# Patient Record
Sex: Male | Born: 1960
Health system: Southern US, Community
[De-identification: ages and names within clinical notes are randomized; demographics above are authoritative.]

## PROBLEM LIST (undated history)

## (undated) DIAGNOSIS — F419 Anxiety disorder, unspecified: Secondary | ICD-10-CM

## (undated) DIAGNOSIS — T7840XA Allergy, unspecified, initial encounter: Secondary | ICD-10-CM

## (undated) DIAGNOSIS — K219 Gastro-esophageal reflux disease without esophagitis: Secondary | ICD-10-CM

## (undated) DIAGNOSIS — F32A Depression, unspecified: Secondary | ICD-10-CM

## (undated) DIAGNOSIS — F329 Major depressive disorder, single episode, unspecified: Secondary | ICD-10-CM

## (undated) DIAGNOSIS — E785 Hyperlipidemia, unspecified: Secondary | ICD-10-CM

## (undated) DIAGNOSIS — I10 Essential (primary) hypertension: Secondary | ICD-10-CM

## (undated) DIAGNOSIS — G4730 Sleep apnea, unspecified: Secondary | ICD-10-CM

## (undated) HISTORY — PX: VASECTOMY REVERSAL: SHX243

## (undated) HISTORY — DX: Hyperlipidemia, unspecified: E78.5

## (undated) HISTORY — PX: CHOLECYSTECTOMY: SHX55

## (undated) HISTORY — DX: Anxiety disorder, unspecified: F41.9

## (undated) HISTORY — DX: Gastro-esophageal reflux disease without esophagitis: K21.9

## (undated) HISTORY — DX: Essential (primary) hypertension: I10

## (undated) HISTORY — DX: Depression, unspecified: F32.A

## (undated) HISTORY — PX: COLONOSCOPY: SHX174

## (undated) HISTORY — DX: Sleep apnea, unspecified: G47.30

## (undated) HISTORY — PX: TONSILLECTOMY AND ADENOIDECTOMY: SUR1326

## (undated) HISTORY — DX: Allergy, unspecified, initial encounter: T78.40XA

## (undated) HISTORY — PX: CARDIAC CATHETERIZATION: SHX172

---
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
## 1985-09-21 HISTORY — PX: VASECTOMY: SHX75

## 1992-09-21 HISTORY — PX: VASECTOMY REVERSAL: SHX243

## 1993-09-21 HISTORY — PX: OTHER SURGICAL HISTORY: SHX169

## 1998-09-21 HISTORY — PX: CHOLECYSTECTOMY: SHX55

## 1998-09-21 HISTORY — PX: CHOLECYSTECTOMY, LAPAROSCOPIC: SHX56

## 1999-10-19 ENCOUNTER — Emergency Department (HOSPITAL_COMMUNITY): Admission: EM | Admit: 1999-10-19 | Discharge: 1999-10-19 | Payer: Self-pay | Admitting: *Deleted

## 2010-11-07 LAB — HM COLONOSCOPY: HM Colonoscopy: NORMAL

## 2013-07-07 ENCOUNTER — Encounter: Payer: Self-pay | Admitting: Family Medicine

## 2013-07-07 ENCOUNTER — Ambulatory Visit (INDEPENDENT_AMBULATORY_CARE_PROVIDER_SITE_OTHER): Payer: Commercial Managed Care - PPO | Admitting: Family Medicine

## 2013-07-07 VITALS — BP 116/78 | HR 76 | Temp 98.1°F | Ht 73.0 in | Wt 218.0 lb

## 2013-07-07 DIAGNOSIS — E785 Hyperlipidemia, unspecified: Secondary | ICD-10-CM | POA: Insufficient documentation

## 2013-07-07 DIAGNOSIS — N529 Male erectile dysfunction, unspecified: Secondary | ICD-10-CM | POA: Insufficient documentation

## 2013-07-07 DIAGNOSIS — G4700 Insomnia, unspecified: Secondary | ICD-10-CM

## 2013-07-07 DIAGNOSIS — K219 Gastro-esophageal reflux disease without esophagitis: Secondary | ICD-10-CM

## 2013-07-07 DIAGNOSIS — Z125 Encounter for screening for malignant neoplasm of prostate: Secondary | ICD-10-CM

## 2013-07-07 DIAGNOSIS — Z0000 Encounter for general adult medical examination without abnormal findings: Secondary | ICD-10-CM | POA: Insufficient documentation

## 2013-07-07 DIAGNOSIS — I10 Essential (primary) hypertension: Secondary | ICD-10-CM | POA: Insufficient documentation

## 2013-07-07 LAB — LIPID PANEL
Cholesterol: 152 mg/dL (ref 0–200)
HDL: 47 mg/dL (ref >39)
Total CHOL/HDL Ratio: 3.2 Ratio
Triglycerides: 163 mg/dL — ABNORMAL HIGH (ref <150)

## 2013-07-07 LAB — COMPREHENSIVE METABOLIC PANEL
ALT: 21 U/L (ref 0–53)
Albumin: 4.3 g/dL (ref 3.5–5.2)
BUN: 18 mg/dL (ref 6–23)
CO2: 30 mEq/L (ref 19–32)
Calcium: 9.8 mg/dL (ref 8.4–10.5)
Chloride: 102 mEq/L (ref 96–112)
Creat: 1.09 mg/dL (ref 0.50–1.35)
Potassium: 4.4 mEq/L (ref 3.5–5.3)
Total Bilirubin: 0.9 mg/dL (ref 0.3–1.2)

## 2013-07-07 MED ORDER — OMEPRAZOLE 20 MG PO CPDR: 20.0000 mg | DELAYED_RELEASE_CAPSULE | Freq: Every day | ORAL | Status: DC | PRN

## 2013-07-07 MED ORDER — ZOLPIDEM TARTRATE 10 MG PO TABS: 10.0000 mg | ORAL_TABLET | Freq: Every evening | ORAL | Status: DC | PRN

## 2013-07-07 MED ORDER — TADALAFIL 5 MG PO TABS: 5.0000 mg | ORAL_TABLET | Freq: Every day | ORAL | Status: DC | PRN

## 2013-07-07 MED ORDER — RANITIDINE HCL 150 MG PO CAPS: 150.0000 mg | ORAL_CAPSULE | Freq: Two times a day (BID) | ORAL | Status: DC | PRN

## 2013-07-07 MED ORDER — ROSUVASTATIN CALCIUM 40 MG PO TABS: 40.0000 mg | ORAL_TABLET | Freq: Every day | ORAL | Status: DC

## 2013-07-07 MED ORDER — OLMESARTAN MEDOXOMIL 20 MG PO TABS: 20.0000 mg | ORAL_TABLET | Freq: Every day | ORAL | Status: DC

## 2013-07-07 NOTE — Patient Instructions (Signed)
It was lovely to meet you. We will call you with your lab results. You may also view them online.

## 2013-07-07 NOTE — Progress Notes (Signed)
Subjective:    Patient ID: Joseph Church, male    DOB: 1960-10-17, 52 y.o.   MRN: [id number]  HPI  Very pleasant 2 male here to establish care.  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  HTN- on Benicar 20 mg daily.  BP has been well controlled on this. No CP, SOB or LE edema.  BP was not controlled on amlodipine and metoprolol worsened ED.  HLD- on Crestor 40 mg qhs.  Due for labs.  Denies myalgias.  Insomnia- takes Ambien approx 20 out of 30 days.  Does not take on weekends or nights that he does not have to work.  Could not tolerate antihistamines or lunesta.  GERD- on Zantac and Omeprazole.  Non smoker. Does drink energy drinks.  UTD colonoscopy- 2012- cleared for 10 years.  ED- takes daily cialis without side effects.  He is pleased with results.  Has been dating someone since May.  Patient Active Problem List   Diagnosis Date Noted  . Insomnia 07/07/2013  . Routine general medical examination at a health care facility 07/07/2013  . Erectile dysfunction 07/07/2013  . GERD (gastroesophageal reflux disease)   . Hyperlipidemia   . Hypertension    Past Medical History  Diagnosis Date  . GERD (gastroesophageal reflux disease)   . Hyperlipidemia   . Hypertension    Past Surgical History  Procedure Laterality Date  . Cholecystectomy, laparoscopic  2000  . Tonsillectomy and adenoidectomy  1960's  . Vasectomy  1987  . Vasectomy reversal  1994  . Keratotomy  1995   History  Substance Use Topics  . Smoking status: Never Smoker   . Smokeless tobacco: Not on file  . Alcohol Use: 5.0 oz/week    10 drink(s) per week   Family History  Problem Relation Age of Onset  . Cancer Mother   . Cancer Father     lymphoma   No Known Allergies No current outpatient prescriptions on file prior to visit.   No current facility-administered medications on file prior to visit.   The PMH, PSH, Social History, Family History, Medications, and allergies have been  reviewed in [facility] and have been updated if relevant.  Review of Systems    See HPI No CP, SOB or blurred vision Denies insomnia No abdominal pain Objective:   Physical Exam BP 116/78  Pulse 76  Temp(Src) 98.1 F (36.7 C) (Oral)  Ht 6\' 1"  (1.854 m)  Wt 218 lb (98.884 kg)  BMI 28.77 kg/m2  SpO2 97% General:  pleasant male in NAD Eyes:  PERRL Ears:  External ear exam shows no significant lesions or deformities.  Otoscopic examination reveals clear canals, tympanic membranes are intact bilaterally without bulging, retraction, inflammation or discharge. Hearing is grossly normal bilaterally. Nose:  External nasal examination shows no deformity or inflammation. Nasal mucosa are pink and moist without lesions or exudates. Mouth:  Oral mucosa and oropharynx without lesions or exudates.  Teeth in good repair. Neck:  no carotid bruit or thyromegaly no cervical or supraclavicular lymphadenopathy  Lungs:  Normal respiratory effort, chest expands symmetrically. Lungs are clear to auscultation, no crackles or wheezes. Heart:  Normal rate and regular rhythm. S1 and S2 normal without gallop, murmur, click, rub or other extra sounds. Abdomen:  Bowel sounds positive,abdomen soft and non-tender without masses, organomegaly or hernias noted. Pulses:  R and L posterior tibial pulses are full and equal bilaterally  Extremities:  no edema    Assessment & Plan:  1. GERD (gastroesophageal reflux disease) Well controlled on Zantac and PPI.  2. Hyperlipidemia Refilled Crestor.  Check lipids today. - Comprehensive metabolic panel - Lipid Panel  3. Hypertension Stable on Benicar.  Rx refilled. - Comprehensive metabolic panel  4. Insomnia Stable on ambien. Rx refilled.  5. Screening for prostate cancer   He desires testing PSA at this point.  - PSA  6. Erectile dysfunction On cialis daily. Rx refilled.

## 2013-07-11 ENCOUNTER — Encounter: Payer: Self-pay | Admitting: *Deleted

## 2013-11-07 ENCOUNTER — Ambulatory Visit: Payer: Commercial Managed Care - PPO | Admitting: Family Medicine

## 2013-11-09 ENCOUNTER — Encounter: Payer: Self-pay | Admitting: Family Medicine

## 2013-11-09 ENCOUNTER — Ambulatory Visit (INDEPENDENT_AMBULATORY_CARE_PROVIDER_SITE_OTHER): Payer: Commercial Managed Care - PPO | Admitting: Family Medicine

## 2013-11-09 ENCOUNTER — Ambulatory Visit: Payer: Commercial Managed Care - PPO | Admitting: Family Medicine

## 2013-11-09 VITALS — BP 122/74 | HR 76 | Temp 98.1°F | Ht 72.75 in | Wt 223.5 lb

## 2013-11-09 DIAGNOSIS — E785 Hyperlipidemia, unspecified: Secondary | ICD-10-CM

## 2013-11-09 DIAGNOSIS — G4700 Insomnia, unspecified: Secondary | ICD-10-CM

## 2013-11-09 DIAGNOSIS — I10 Essential (primary) hypertension: Secondary | ICD-10-CM

## 2013-11-09 MED ORDER — OMEPRAZOLE 20 MG PO CPDR: 20.0000 mg | DELAYED_RELEASE_CAPSULE | Freq: Every day | ORAL | Status: DC | PRN

## 2013-11-09 MED ORDER — OLMESARTAN MEDOXOMIL 20 MG PO TABS: 20.0000 mg | ORAL_TABLET | Freq: Every day | ORAL | Status: DC

## 2013-11-09 MED ORDER — ZOLPIDEM TARTRATE 10 MG PO TABS: 10.0000 mg | ORAL_TABLET | Freq: Every evening | ORAL | Status: DC | PRN

## 2013-11-09 MED ORDER — ROSUVASTATIN CALCIUM 40 MG PO TABS: 40.0000 mg | ORAL_TABLET | Freq: Every day | ORAL | Status: DC

## 2013-11-09 MED ORDER — RANITIDINE HCL 150 MG PO CAPS: 150.0000 mg | ORAL_CAPSULE | Freq: Two times a day (BID) | ORAL | Status: DC | PRN

## 2013-11-09 NOTE — Progress Notes (Signed)
Subjective:    Patient ID: Joseph Church, male    DOB: 14-Aug-1961, 53 y.o.   MRN: [id number]  HPI  Very pleasant 53 yo male here for med refills. He has no complaints today.  Doing well.  Working mainly in [employer].  HTN- on Benicar 20 mg daily.  BP has been well controlled on this. No CP, SOB or LE edema.  BP was not controlled on amlodipine and metoprolol worsened ED.  HLD- on Crestor 40 mg qhs.   Denies myalgias. Lab Results  Component Value Date   CHOL 152 07/07/2013   HDL 47 07/07/2013   LDLCALC 72 07/07/2013   TRIG 163* 07/07/2013   CHOLHDL 3.2 07/07/2013    Insomnia- takes Ambien approx 20 out of 30 days.  Does not take on weekends or nights that he does not have to work.  Could not tolerate antihistamines or lunesta.  ED- takes daily cialis without side effects.  He is pleased with results.    Patient Active Problem List   Diagnosis Date Noted  . Insomnia 07/07/2013  . Erectile dysfunction 07/07/2013  . GERD (gastroesophageal reflux disease)   . Hyperlipidemia   . Hypertension    Past Medical History  Diagnosis Date  . GERD (gastroesophageal reflux disease)   . Hyperlipidemia   . Hypertension    Past Surgical History  Procedure Laterality Date  . Cholecystectomy, laparoscopic  2000  . Tonsillectomy and adenoidectomy  1960's  . Vasectomy  1987  . Vasectomy reversal  1994  . Keratotomy  1995   History  Substance Use Topics  . Smoking status: Never Smoker   . Smokeless tobacco: Not on file  . Alcohol Use: 5.0 oz/week    10 drink(s) per week   Family History  Problem Relation Age of Onset  . Cancer Mother   . Cancer Father     lymphoma   No Known Allergies Current Outpatient Prescriptions on File Prior to Visit  Medication Sig Dispense Refill  . aspirin 81 MG tablet Take 81 mg by mouth daily.      Marland Kitchen olmesartan (BENICAR) 20 MG tablet Take 1 tablet (20 mg total) by mouth daily.  30 tablet  11  . omeprazole (PRILOSEC) 20 MG capsule Take 1 capsule  (20 mg total) by mouth daily as needed.  30 capsule  11  . ranitidine (ZANTAC) 150 MG capsule Take 1 capsule (150 mg total) by mouth 2 (two) times daily as needed for heartburn.  30 capsule  11  . rosuvastatin (CRESTOR) 40 MG tablet Take 1 tablet (40 mg total) by mouth at bedtime.  30 tablet  11  . tadalafil (CIALIS) 5 MG tablet Take 1 tablet (5 mg total) by mouth daily as needed for erectile dysfunction.  30 tablet  6  . zolpidem (AMBIEN) 10 MG tablet Take 1 tablet (10 mg total) by mouth at bedtime as needed for sleep.  30 tablet  3   No current facility-administered medications on file prior to visit.   The PMH, PSH, Social History, Family History, Medications, and allergies have been reviewed in [facility], and have been updated if relevant.  Review of Systems    See HPI No CP, SOB or blurred vision Denies insomnia No abdominal pain Objective:   Physical Exam BP 122/74  Pulse 76  Temp(Src) 98.1 F (36.7 C) (Oral)  Ht 6' 0.75" (1.848 m)  Wt 223 lb 8 oz (101.379 kg)  BMI 29.69 kg/m2  SpO2 97%  General:  pleasant  male in NAD Eyes:  PERRL Ears:  External ear exam shows no significant lesions or deformities.  Otoscopic examination reveals clear canals, tympanic membranes are intact bilaterally without bulging, retraction, inflammation or discharge. Hearing is grossly normal bilaterally. Nose:  External nasal examination shows no deformity or inflammation. Nasal mucosa are pink and moist without lesions or exudates. Mouth:  Oral mucosa and oropharynx without lesions or exudates.  Teeth in good repair. Neck:  no carotid bruit or thyromegaly no cervical or supraclavicular lymphadenopathy  Lungs:  Normal respiratory effort, chest expands symmetrically. Lungs are clear to auscultation, no crackles or wheezes. Heart:  Normal rate and regular rhythm. S1 and S2 normal without gallop, murmur, click, rub or other extra sounds. Abdomen:  Bowel sounds positive,abdomen soft and non-tender without  masses, organomegaly or hernias noted. Pulses:  R and L posterior tibial pulses are full and equal bilaterally  Extremities:  no edema    Assessment & Plan:

## 2013-11-09 NOTE — Progress Notes (Signed)
Pre-visit discussion using our clinic review tool. No additional management support is needed unless otherwise documented below in the visit note.  

## 2013-11-09 NOTE — Assessment & Plan Note (Signed)
Well controlled on current dose of Crestor. No changes.

## 2013-11-09 NOTE — Assessment & Plan Note (Signed)
Well controlled on current rx. No changes. 

## 2013-11-09 NOTE — Assessment & Plan Note (Signed)
Uses Joseph Church but tries to only take when he has to work the next day. Rx refilled today.

## 2013-12-19 ENCOUNTER — Encounter: Payer: Self-pay | Admitting: Internal Medicine

## 2013-12-19 ENCOUNTER — Ambulatory Visit (INDEPENDENT_AMBULATORY_CARE_PROVIDER_SITE_OTHER): Payer: Commercial Managed Care - PPO | Admitting: Internal Medicine

## 2013-12-19 VITALS — BP 122/84 | HR 71 | Temp 98.2°F | Wt 222.2 lb

## 2013-12-19 DIAGNOSIS — R079 Chest pain, unspecified: Secondary | ICD-10-CM

## 2013-12-19 LAB — CBC
HCT: 46.9 % (ref 39.0–52.0)
HEMOGLOBIN: 16.3 g/dL (ref 13.0–17.0)
MCH: 30.5 pg (ref 26.0–34.0)
MCHC: 34.8 g/dL (ref 30.0–36.0)
MCV: 87.8 fL (ref 78.0–100.0)
Platelets: 231 10*3/uL (ref 150–400)
RBC: 5.34 MIL/uL (ref 4.22–5.81)
RDW: 13.5 % (ref 11.5–15.5)
WBC: 8.6 10*3/uL (ref 4.0–10.5)

## 2013-12-19 LAB — COMPREHENSIVE METABOLIC PANEL
ALK PHOS: 60 U/L (ref 39–117)
ALT: 23 U/L (ref 0–53)
AST: 20 U/L (ref 0–37)
Albumin: 4.3 g/dL (ref 3.5–5.2)
BUN: 15 mg/dL (ref 6–23)
CO2: 27 meq/L (ref 19–32)
CREATININE: 1.05 mg/dL (ref 0.50–1.35)
Calcium: 9.7 mg/dL (ref 8.4–10.5)
Chloride: 101 mEq/L (ref 96–112)
GLUCOSE: 97 mg/dL (ref 70–99)
Potassium: 4.6 mEq/L (ref 3.5–5.3)
SODIUM: 138 meq/L (ref 135–145)
Total Bilirubin: 0.5 mg/dL (ref 0.2–1.2)
Total Protein: 6.9 g/dL (ref 6.0–8.3)

## 2013-12-19 LAB — TROPONIN I

## 2013-12-19 LAB — CK TOTAL AND CKMB (NOT AT ARMC)
CK TOTAL: 128 U/L (ref 7–232)
CK, MB: 1.2 ng/mL (ref 0.0–5.0)
RELATIVE INDEX: 0.9 (ref 0.0–4.0)

## 2013-12-19 NOTE — Progress Notes (Signed)
Pre visit review using our clinic review tool, if applicable. No additional management support is needed unless otherwise documented below in the visit note. 

## 2013-12-19 NOTE — Progress Notes (Signed)
Subjective:    Patient ID: Joseph Church, male    DOB: June 11, 1961, 53 y.o.   MRN: [id number]  HPI  Pt presents to the clinic today with c/o chest pain x 2 weeks. He reports the pain is a dull, achy pain but seems to have been more constant over the last day. The pain does not radiate. It is not worse with exertion. He denies trauma to the chest.  He denies chest tightness, sweating, dizziness, blurred vision or shortness of breath. He does bring in a copy of an ECG he had this am. It shows incomplete RBBB, short QTc interval (? Hypercalcemia), and possible myocardial ischemia. He did have a cardiac cath about 3 years ago with did show 255 blockage in 2 vessels. He does have a history of HTN and HLD both of which are well controlled.   Review of Systems      Past Medical History  Diagnosis Date  . GERD (gastroesophageal reflux disease)   . Hyperlipidemia   . Hypertension     Current Outpatient Prescriptions  Medication Sig Dispense Refill  . aspirin 81 MG tablet Take 81 mg by mouth daily.      Marland Kitchen olmesartan (BENICAR) 20 MG tablet Take 1 tablet (20 mg total) by mouth daily.  90 tablet  3  . omeprazole (PRILOSEC) 20 MG capsule Take 1 capsule (20 mg total) by mouth daily as needed.  90 capsule  3  . ranitidine (ZANTAC) 150 MG capsule Take 1 capsule (150 mg total) by mouth 2 (two) times daily as needed for heartburn.  90 capsule  3  . rosuvastatin (CRESTOR) 40 MG tablet Take 1 tablet (40 mg total) by mouth at bedtime.  90 tablet  3  . tadalafil (CIALIS) 5 MG tablet Take 1 tablet (5 mg total) by mouth daily as needed for erectile dysfunction.  30 tablet  6  . zolpidem (AMBIEN) 10 MG tablet Take 1 tablet (10 mg total) by mouth at bedtime as needed for sleep.  30 tablet  3   No current facility-administered medications for this visit.    No Known Allergies  Family History  Problem Relation Age of Onset  . Cancer Mother   . Cancer Father     lymphoma    History   Social History    . Marital Status: Divorced    Spouse Name: N/A    Number of Children: N/A  . Years of Education: N/A   Occupational History  . Not on file.   Social History Main Topics  . Smoking status: Never Smoker   . Smokeless tobacco: Not on file  . Alcohol Use: 5.0 oz/week    10 drink(s) per week  . Drug Use: Not on file  . Sexual Activity: Not on file   Other Topics Concern  . 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  Respiratory: Denies difficulty breathing, shortness of breath, cough or sputum production.   Cardiovascular: Pt reports chest pain. Denies chest tightness, palpitations or swelling in the hands or feet.  Neurological: Denies dizziness, difficulty with memory, difficulty with speech or problems with balance and coordination.   No other specific complaints in a complete review of systems (except as listed in HPI above).  Objective:   Physical  Exam   BP 122/84  Pulse 71  Temp(Src) 98.2 F (36.8 C) (Oral)  Wt 222 lb 4 oz (100.812 kg)  SpO2 99% Wt Readings from Last 3 Encounters:  12/19/13 222 lb 4 oz (100.812 kg)  11/09/13 223 lb 8 oz (101.379 kg)  07/07/13 218 lb (98.884 kg)    General: Appears his stated age, well developed, well nourished in NAD. Cardiovascular: Normal rate and rhythm. S1,S2 noted.  No murmur, rubs or gallops noted. No JVD or BLE edema. No carotid bruits noted. Pulmonary/Chest: Normal effort and positive vesicular breath sounds. No respiratory distress. No wheezes, rales or ronchi noted.  Neurological: Alert and oriented. Marland Kitchen   BMET    Component Value Date/Time   NA 140 07/07/2013 1433   K 4.4 07/07/2013 1433   CL 102 07/07/2013 1433   CO2 30 07/07/2013 1433   GLUCOSE 87 07/07/2013 1433   BUN 18 07/07/2013 1433   CREATININE 1.09  07/07/2013 1433   CALCIUM 9.8 07/07/2013 1433    Lipid Panel     Component Value Date/Time   CHOL 152 07/07/2013 1433   TRIG 163* 07/07/2013 1433   HDL 47 07/07/2013 1433   CHOLHDL 3.2 07/07/2013 1433   VLDL 33 07/07/2013 1433   LDLCALC 72 07/07/2013 1433           Assessment & Plan:   Chest pain:  ECG concerning for hypercalcemia and possible myocardial ischemia He is not in any distress at the moment Will check CBC, CMET and cardiac enzymes stat  Will call you as soon as the labs come back, if worse before I call you, please go straight to the ER.

## 2013-12-19 NOTE — Patient Instructions (Addendum)
Chest Pain (Nonspecific) °It is often hard to give a specific diagnosis for the cause of chest pain. There is always a chance that your pain could be related to something serious, such as a heart attack or a blood clot in the lungs. You need to follow up with your caregiver for further evaluation. °CAUSES  °· Heartburn. °· Pneumonia or bronchitis. °· Anxiety or stress. °· Inflammation around your heart (pericarditis) or lung (pleuritis or pleurisy). °· A blood clot in the lung. °· A collapsed lung (pneumothorax). It can develop suddenly on its own (spontaneous pneumothorax) or from injury (trauma) to the chest. °· Shingles infection (herpes zoster virus). °The chest wall is composed of bones, muscles, and cartilage. Any of these can be the source of the pain. °· The bones can be bruised by injury. °· The muscles or cartilage can be strained by coughing or overwork. °· The cartilage can be affected by inflammation and become sore (costochondritis). °DIAGNOSIS  °Lab tests or other studies, such as X-rays, electrocardiography, stress testing, or cardiac imaging, may be needed to find the cause of your pain.  °TREATMENT  °· Treatment depends on what may be causing your chest pain. Treatment may include: °· Acid blockers for heartburn. °· Anti-inflammatory medicine. °· Pain medicine for inflammatory conditions. °· Antibiotics if an infection is present. °· You may be advised to change lifestyle habits. This includes stopping smoking and avoiding alcohol, caffeine, and chocolate. °· You may be advised to keep your head raised (elevated) when sleeping. This reduces the chance of acid going backward from your stomach into your esophagus. °· Most of the time, nonspecific chest pain will improve within 2 to 3 days with rest and mild pain medicine. °HOME CARE INSTRUCTIONS  °· If antibiotics were prescribed, take your antibiotics as directed. Finish them even if you start to feel better. °· For the next few days, avoid physical  activities that bring on chest pain. Continue physical activities as directed. °· Do not smoke. °· Avoid drinking alcohol. °· Only take over-the-counter or prescription medicine for pain, discomfort, or fever as directed by your caregiver. °· Follow your caregiver's suggestions for further testing if your chest pain does not go away. °· Keep any follow-up appointments you made. If you do not go to an appointment, you could develop lasting (chronic) problems with pain. If there is any problem keeping an appointment, you must call to reschedule. °SEEK MEDICAL CARE IF:  °· You think you are having problems from the medicine you are taking. Read your medicine instructions carefully. °· Your chest pain does not go away, even after treatment. °· You develop a rash with blisters on your chest. °SEEK IMMEDIATE MEDICAL CARE IF:  °· You have increased chest pain or pain that spreads to your arm, neck, jaw, back, or abdomen. °· You develop shortness of breath, an increasing cough, or you are coughing up blood. °· You have severe back or abdominal pain, feel nauseous, or vomit. °· You develop severe weakness, fainting, or chills. °· You have a fever. °THIS IS AN EMERGENCY. Do not wait to see if the pain will go away. Get medical help at once. Call your local emergency services (911 in [place] Do not drive yourself to the hospital. °MAKE SURE YOU:  °· Understand these instructions. °· Will watch your condition. °· Will get help right away if you are not doing well or get worse. °Document Released: 06/17/2005 Document Revised: 11/30/2011 Document Reviewed: 04/12/2008 °ExitCare® Patient Information ©2014 [employer]   [employer]. ° °

## 2014-01-11 ENCOUNTER — Encounter: Payer: Self-pay | Admitting: Cardiovascular Disease

## 2014-01-11 ENCOUNTER — Ambulatory Visit (INDEPENDENT_AMBULATORY_CARE_PROVIDER_SITE_OTHER): Payer: Commercial Managed Care - PPO | Admitting: Cardiovascular Disease

## 2014-01-11 VITALS — BP 102/82 | HR 69 | Ht 73.0 in | Wt 225.8 lb

## 2014-01-11 DIAGNOSIS — E785 Hyperlipidemia, unspecified: Secondary | ICD-10-CM

## 2014-01-11 DIAGNOSIS — R079 Chest pain, unspecified: Secondary | ICD-10-CM

## 2014-01-11 DIAGNOSIS — I10 Essential (primary) hypertension: Secondary | ICD-10-CM

## 2014-01-11 NOTE — Patient Instructions (Signed)
Your physician has requested that you have an exercise tolerance test. For further information please visit [email or web address]. Please also follow instruction sheet, as given.   Your physician recommends that you schedule a follow-up appointment in:  As needed

## 2014-01-14 ENCOUNTER — Encounter: Payer: Self-pay | Admitting: Cardiovascular Disease

## 2014-01-14 DIAGNOSIS — R079 Chest pain, unspecified: Secondary | ICD-10-CM | POA: Insufficient documentation

## 2014-01-14 NOTE — Assessment & Plan Note (Signed)
Blood pressure is well controlled on current medications. 

## 2014-01-14 NOTE — Progress Notes (Signed)
HPI  This is a 53 year old man who was referred for evaluation of chest pain. He has no previous cardiac history. He has known history of hypertension, hyperlipidemia, gastroesophageal reflux disease and erectile dysfunction. He reports chest pain over the last 4-6 weeks. He describes substernal tightness feeling which does not radiate. This happened at rest and does not worsen with physical activities. The pain is not pleuritic.  He had previous cardiac catheterization done in 2003 in [facility] which showed 25% stenosis in the proximal RCA and 25% stenosis in the proximal left circumflex. Ejection fraction was 56%. No cardiac events since then.  He does not smoke. He has no family history of coronary artery disease. He works as a PA at [employer].  No Known Allergies   Current Outpatient Prescriptions on File Prior to Visit  Medication Sig Dispense Refill  . aspirin 81 MG tablet Take 81 mg by mouth daily.      Marland Kitchen olmesartan (BENICAR) 20 MG tablet Take 1 tablet (20 mg total) by mouth daily.  90 tablet  3  . omeprazole (PRILOSEC) 20 MG capsule Take 1 capsule (20 mg total) by mouth daily as needed.  90 capsule  3  . ranitidine (ZANTAC) 150 MG capsule Take 1 capsule (150 mg total) by mouth 2 (two) times daily as needed for heartburn.  90 capsule  3  . rosuvastatin (CRESTOR) 40 MG tablet Take 1 tablet (40 mg total) by mouth at bedtime.  90 tablet  3  . zolpidem (AMBIEN) 10 MG tablet Take 1 tablet (10 mg total) by mouth at bedtime as needed for sleep.  30 tablet  3   No current facility-administered medications on file prior to visit.     Past Medical History  Diagnosis Date  . GERD (gastroesophageal reflux disease)   . Hyperlipidemia   . Hypertension      Past Surgical History  Procedure Laterality Date  . Cholecystectomy, laparoscopic  2000  . Tonsillectomy and adenoidectomy  1960's  . Vasectomy  1987  . Vasectomy reversal  1994  . Keratotomy  1995  . Cardiac  catheterization      [facility];[place]     Family History  Problem Relation Age of Onset  . Cancer Mother   . Cancer Father     lymphoma  . Hypertension Father   . Hyperlipidemia Father      History   Social History  . Marital Status: Divorced    Spouse Name: N/A    Number of Children: N/A  . Years of Education: N/A   Occupational History  . Not on file.   Social History Main Topics  . Smoking status: Never Smoker   . Smokeless tobacco: Not on file  . Alcohol Use: 5.0 oz/week    10 drink(s) per week     Comment: daily  . Drug Use: No  . Sexual Activity: Not on file   Other Topics Concern  . 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 10 point review of system was performed. It is negative other than that mentioned in the history of present illness.   PHYSICAL EXAM   BP 102/82  Pulse 69  Ht 6\' 1"  (1.854 m)  Wt 225 lb 12 oz (102.4 kg)  BMI 29.79 kg/m2 Constitutional: He is  oriented to person, place, and time. He appears well-developed and well-nourished. No distress.  HENT: No nasal discharge.  Head: Normocephalic and atraumatic.  Eyes: Pupils are equal and round.  No discharge. Neck: Normal range of motion. Neck supple. No JVD present. No thyromegaly present.  Cardiovascular: Normal rate, regular rhythm, normal heart sounds. Exam reveals no gallop and no friction rub. No murmur heard.  Pulmonary/Chest: Effort normal and breath sounds normal. No stridor. No respiratory distress. He has no wheezes. He has no rales. He exhibits no tenderness.  Abdominal: Soft. Bowel sounds are normal. He exhibits no distension. There is no tenderness. There is no rebound and no guarding.  Musculoskeletal: Normal range of motion. He exhibits no edema and no tenderness.  Neurological: He is alert and oriented to person,  place, and time. Coordination normal.  Skin: Skin is warm and dry. No rash noted. He is not diaphoretic. No erythema. No pallor.  Psychiatric: He has a normal mood and affect. His behavior is normal. Judgment and thought content normal.       EKG: Normal sinus rhythm with no significant ST or T wave changes. Normal QT interval.   ASSESSMENT AND PLAN

## 2014-01-14 NOTE — Assessment & Plan Note (Signed)
Lab Results  Component Value Date   CHOL 152 07/07/2013   HDL 47 07/07/2013   LDLCALC 72 07/07/2013   TRIG 163* 07/07/2013   CHOLHDL 3.2 07/07/2013   Lipid profile is well controlled on high-dose Crestor.

## 2014-01-14 NOTE — Assessment & Plan Note (Signed)
The chest pain is overall atypical and has been nonexertional. Cardiac exam is unremarkable and baseline ECG is normal. He does have multiple risk factors for coronary artery disease and thus I recommend evaluation with a treadmill stress test. If cardiac workup is negative and he continues to have chest pain, further noncardiac evaluation might be indicated.

## 2014-01-18 ENCOUNTER — Encounter: Payer: Self-pay | Admitting: Cardiovascular Disease

## 2014-01-26 ENCOUNTER — Encounter: Payer: Commercial Managed Care - PPO | Admitting: Cardiovascular Disease

## 2014-02-02 ENCOUNTER — Encounter: Payer: Self-pay | Admitting: Cardiovascular Disease

## 2014-02-02 ENCOUNTER — Encounter: Payer: Commercial Managed Care - PPO | Admitting: Cardiovascular Disease

## 2014-02-02 ENCOUNTER — Ambulatory Visit (INDEPENDENT_AMBULATORY_CARE_PROVIDER_SITE_OTHER): Payer: Commercial Managed Care - PPO | Admitting: Cardiovascular Disease

## 2014-02-02 VITALS — BP 111/83 | HR 83 | Ht 73.0 in | Wt 222.0 lb

## 2014-02-02 DIAGNOSIS — R079 Chest pain, unspecified: Secondary | ICD-10-CM

## 2014-02-02 NOTE — Procedures (Signed)
   Treadmill Stress test  Indication: Atypical chest pain  Baseline Data:  Resting EKG shows NSR with rate of 88 bpm, no significant ST or T wave changes Resting blood pressure of 114/92 mm Hg Stand bruce protocal was used.  Exercise Data:  Patient exercised for 9 min 0 sec,  Peak heart rate of 171 bpm.  This was 102 % of the maximum predicted heart rate. No symptoms of chest pain or lightheadedness were reported at peak stress or in recovery.  Peak Blood pressure recorded was 184/84 Maximal work level: 10.1 METs.  Heart rate at 3 minutes in recovery was 115 bpm. BP response: Normal HR response: Normal  EKG with Exercise: Sinus tachycardia with no significant ST changes  FINAL IMPRESSION: Normal exercise stress test. No significant EKG changes concerning for ischemia. Average exercise tolerance.  Recommendation: The chest pain seems to be noncardiac.

## 2014-02-02 NOTE — Patient Instructions (Signed)
Your stress test is normal.

## 2014-02-05 ENCOUNTER — Encounter: Payer: Self-pay | Admitting: Family Medicine

## 2014-02-13 ENCOUNTER — Encounter: Payer: Self-pay | Admitting: Family Medicine

## 2014-03-02 ENCOUNTER — Ambulatory Visit: Payer: Commercial Managed Care - PPO | Admitting: Family Medicine

## 2014-03-28 ENCOUNTER — Other Ambulatory Visit: Payer: Self-pay | Admitting: Family Medicine

## 2014-03-28 NOTE — Telephone Encounter (Signed)
Spoke to pt and informed him Rx has been called in

## 2014-03-28 NOTE — Telephone Encounter (Signed)
Pt left v/m; pt said was told needed to pick up hard copy of ambien rx. Pt request cb when ready for pick up.

## 2014-03-28 NOTE — Telephone Encounter (Signed)
Lm on pts vm informing him Rx has been called in to requested pharmacy; hard copy not required

## 2014-03-28 NOTE — Telephone Encounter (Signed)
Pt requesting medication refill. Last f/u appt 10/2013 with future appt 06/2014. pls advise

## 2014-05-09 ENCOUNTER — Telehealth: Payer: Self-pay | Admitting: *Deleted

## 2014-05-09 MED ORDER — TADALAFIL 5 MG PO TABS: 5.0000 mg | ORAL_TABLET | Freq: Every day | ORAL | Status: DC

## 2014-05-10 NOTE — Telephone Encounter (Signed)
Spoke to [place] and advised her ok to fill #[id number].

## 2014-05-10 NOTE — Telephone Encounter (Signed)
Joseph Church at [facility] left v/m; wants to verify cialis quantity, since taking daily should quantity be # 30 or the # 10 submitted. Joseph Church request cb.

## 2014-06-07 ENCOUNTER — Ambulatory Visit (INDEPENDENT_AMBULATORY_CARE_PROVIDER_SITE_OTHER): Payer: 59 | Admitting: Family Medicine

## 2014-06-07 ENCOUNTER — Ambulatory Visit (INDEPENDENT_AMBULATORY_CARE_PROVIDER_SITE_OTHER)
Admission: RE | Admit: 2014-06-07 | Discharge: 2014-06-07 | Disposition: A | Discharge status: Home / Self Care | Payer: 59 | Source: Ambulatory Visit | Attending: Family Medicine | Admitting: Family Medicine

## 2014-06-07 ENCOUNTER — Encounter: Payer: Self-pay | Admitting: Family Medicine

## 2014-06-07 VITALS — BP 106/68 | HR 77 | Temp 98.0°F | Wt 224.8 lb

## 2014-06-07 DIAGNOSIS — M542 Cervicalgia: Secondary | ICD-10-CM

## 2014-06-07 MED ORDER — PREDNISONE 20 MG PO TABS: ORAL_TABLET | ORAL | Status: DC

## 2014-06-07 MED ORDER — METAXALONE 400 MG PO TABS: ORAL_TABLET | ORAL | Status: DC

## 2014-06-07 NOTE — Progress Notes (Signed)
Pre visit review using our clinic review tool, if applicable. No additional management support is needed unless otherwise documented below in the visit note. 

## 2014-06-07 NOTE — Patient Instructions (Signed)
Great to see you.  Take prednisone as directed, as needed muscle relaxant. I will call you with your xray results.

## 2014-06-07 NOTE — Progress Notes (Signed)
SUBJECTIVE:  Joseph Church is a 53 y.o. male who complains of neck pain for over 2 week(s) duration. The pain is positional with movement of neck without radiation of pain down the arms.   Symptoms have been worsening since that time. Prior history of neck problems: no prior neck problems. There is no numbness, tingling, weakness in the arms.  Current Outpatient Prescriptions on File Prior to Visit  Medication Sig Dispense Refill  . aspirin 81 MG tablet Take 81 mg by mouth daily.      Marland Kitchen olmesartan (BENICAR) 20 MG tablet Take 1 tablet (20 mg total) by mouth daily.  90 tablet  3  . omeprazole (PRILOSEC) 20 MG capsule Take 1 capsule (20 mg total) by mouth daily as needed.  90 capsule  3  . ranitidine (ZANTAC) 150 MG capsule Take 1 capsule (150 mg total) by mouth 2 (two) times daily as needed for heartburn.  90 capsule  3  . rosuvastatin (CRESTOR) 40 MG tablet Take 1 tablet (40 mg total) by mouth at bedtime.  90 tablet  3  . tadalafil (CIALIS) 5 MG tablet Take 1 tablet (5 mg total) by mouth daily.  10 tablet  3  . zolpidem (AMBIEN) 10 MG tablet TAKE ONE TABLET BY MOUTH AT BEDTIME AS NEEDED FOR SLEEP  30 tablet  3   No current facility-administered medications on file prior to visit.    No Known Allergies  Past Medical History  Diagnosis Date  . GERD (gastroesophageal reflux disease)   . Hyperlipidemia   . Hypertension     Past Surgical History  Procedure Laterality Date  . Cholecystectomy, laparoscopic  2000  . Tonsillectomy and adenoidectomy  1960's  . Vasectomy  1987  . Vasectomy reversal  1994  . Keratotomy  1995  . Cardiac catheterization      [facility];[place]    Family History  Problem Relation Age of Onset  . Cancer Mother   . Cancer Father     lymphoma  . Hypertension Father   . Hyperlipidemia Father     History   Social History  . Marital Status: Divorced    Spouse Name: N/A    Number of Children: N/A  . Years of Education: N/A    Occupational History  . Not on file.   Social History Main Topics  . Smoking status: Never Smoker   . Smokeless tobacco: Not on file  . Alcohol Use: 5.0 oz/week    10 drink(s) per week     Comment: daily  . Drug Use: No  . Sexual Activity: Not on file   Other Topics Concern  . 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  OBJECTIVE: BP 106/68  Pulse 77  Temp(Src) 98 F (36.7 C) (Oral)  Wt 224 lb 12 oz (101.946 kg)  SpO2 97%  Vital signs as noted above. Patient appears to be in mild to moderate pain.  Neck exam: tenderness over lower cervical spine and nuchal area, reduced painful C-spine range of motion, normal neurological exam of arms; normal DTR's, motor, sensory exam, positive Lhermitte's sign. X-Ray: ordered, but results not yet available.  ASSESSMENT:  cervical strain and underlying chronic DJD likely  PLAN:  rest the injured area as much as practical, X-Ray ordered, prescription for muscle relaxant and prednisone. Consider Physical Therapy and XRay studies if not improving.  Call or return to clinic prn if these symptoms worsen or fail to improve as anticipated.

## 2014-06-12 ENCOUNTER — Other Ambulatory Visit: Payer: Self-pay | Admitting: Family Medicine

## 2014-06-12 ENCOUNTER — Encounter: Payer: Self-pay | Admitting: Family Medicine

## 2014-06-12 ENCOUNTER — Emergency Department: Payer: Self-pay | Admitting: Emergency Medicine

## 2014-06-12 DIAGNOSIS — M542 Cervicalgia: Secondary | ICD-10-CM

## 2014-06-19 ENCOUNTER — Ambulatory Visit: Payer: Self-pay | Admitting: Family Medicine

## 2014-06-19 ENCOUNTER — Inpatient Hospital Stay (HOSPITAL_COMMUNITY): Admission: RE | Admit: 2014-06-19 | Payer: 59 | Source: Ambulatory Visit

## 2014-06-20 ENCOUNTER — Encounter: Payer: Self-pay | Admitting: Family Medicine

## 2014-06-21 ENCOUNTER — Encounter: Payer: Self-pay | Admitting: Family Medicine

## 2014-06-25 ENCOUNTER — Encounter: Payer: Self-pay | Admitting: Family Medicine

## 2014-06-28 ENCOUNTER — Other Ambulatory Visit: Payer: Self-pay | Admitting: Family Medicine

## 2014-06-28 DIAGNOSIS — Z0000 Encounter for general adult medical examination without abnormal findings: Secondary | ICD-10-CM

## 2014-06-28 DIAGNOSIS — E785 Hyperlipidemia, unspecified: Secondary | ICD-10-CM

## 2014-06-28 DIAGNOSIS — Z125 Encounter for screening for malignant neoplasm of prostate: Secondary | ICD-10-CM

## 2014-06-28 DIAGNOSIS — I10 Essential (primary) hypertension: Secondary | ICD-10-CM

## 2014-06-29 ENCOUNTER — Telehealth: Payer: Self-pay | Admitting: Family Medicine

## 2014-06-29 NOTE — Telephone Encounter (Signed)
emmi emailed °

## 2014-07-03 ENCOUNTER — Other Ambulatory Visit (INDEPENDENT_AMBULATORY_CARE_PROVIDER_SITE_OTHER): Payer: 59

## 2014-07-03 ENCOUNTER — Other Ambulatory Visit: Payer: Commercial Managed Care - PPO

## 2014-07-03 DIAGNOSIS — Z0000 Encounter for general adult medical examination without abnormal findings: Secondary | ICD-10-CM

## 2014-07-03 DIAGNOSIS — Z125 Encounter for screening for malignant neoplasm of prostate: Secondary | ICD-10-CM

## 2014-07-03 DIAGNOSIS — E785 Hyperlipidemia, unspecified: Secondary | ICD-10-CM

## 2014-07-03 LAB — COMPREHENSIVE METABOLIC PANEL
ALBUMIN: 3.3 g/dL — AB (ref 3.5–5.2)
ALK PHOS: 54 U/L (ref 39–117)
ALT: 28 U/L (ref 0–53)
AST: 27 U/L (ref 0–37)
BUN: 18 mg/dL (ref 6–23)
CALCIUM: 9.4 mg/dL (ref 8.4–10.5)
CO2: 21 meq/L (ref 19–32)
Chloride: 103 mEq/L (ref 96–112)
Creatinine, Ser: 1.1 mg/dL (ref 0.4–1.5)
GFR: 73.38 mL/min (ref >60.00)
GLUCOSE: 98 mg/dL (ref 70–99)
POTASSIUM: 4.6 meq/L (ref 3.5–5.1)
Sodium: 138 mEq/L (ref 135–145)
TOTAL PROTEIN: 6.5 g/dL (ref 6.0–8.3)
Total Bilirubin: 0.6 mg/dL (ref 0.2–1.2)

## 2014-07-03 LAB — LIPID PANEL
CHOLESTEROL: 155 mg/dL (ref 0–200)
HDL: 34.3 mg/dL — ABNORMAL LOW (ref >39.00)
LDL CALC: 81 mg/dL (ref 0–99)
NonHDL: 120.7
TRIGLYCERIDES: 198 mg/dL — AB (ref 0.0–149.0)
Total CHOL/HDL Ratio: 5
VLDL: 39.6 mg/dL (ref 0.0–40.0)

## 2014-07-03 LAB — CBC WITH DIFFERENTIAL/PLATELET
Basophils Absolute: 0 10*3/uL (ref 0.0–0.1)
Basophils Relative: 0.6 % (ref 0.0–3.0)
EOS PCT: 4.5 % (ref 0.0–5.0)
Eosinophils Absolute: 0.3 10*3/uL (ref 0.0–0.7)
HCT: 44.9 % (ref 39.0–52.0)
Hemoglobin: 15 g/dL (ref 13.0–17.0)
Lymphocytes Relative: 29.8 % (ref 12.0–46.0)
Lymphs Abs: 2.1 10*3/uL (ref 0.7–4.0)
MCHC: 33.3 g/dL (ref 30.0–36.0)
MCV: 92 fl (ref 78.0–100.0)
MONOS PCT: 7.7 % (ref 3.0–12.0)
Monocytes Absolute: 0.5 10*3/uL (ref 0.1–1.0)
NEUTROS PCT: 57.4 % (ref 43.0–77.0)
Neutro Abs: 4 10*3/uL (ref 1.4–7.7)
Platelets: 242 10*3/uL (ref 150.0–400.0)
RBC: 4.88 Mil/uL (ref 4.22–5.81)
RDW: 13.3 % (ref 11.5–15.5)
WBC: 7.1 10*3/uL (ref 4.0–10.5)

## 2014-07-03 LAB — PSA: PSA: 0.61 ng/mL (ref 0.10–4.00)

## 2014-07-10 ENCOUNTER — Encounter: Payer: Self-pay | Admitting: Family Medicine

## 2014-07-10 ENCOUNTER — Ambulatory Visit (INDEPENDENT_AMBULATORY_CARE_PROVIDER_SITE_OTHER): Payer: 59 | Admitting: Family Medicine

## 2014-07-10 VITALS — BP 110/68 | HR 77 | Temp 98.0°F | Ht 72.75 in | Wt 222.8 lb

## 2014-07-10 DIAGNOSIS — M542 Cervicalgia: Secondary | ICD-10-CM

## 2014-07-10 DIAGNOSIS — G4700 Insomnia, unspecified: Secondary | ICD-10-CM

## 2014-07-10 DIAGNOSIS — Z0000 Encounter for general adult medical examination without abnormal findings: Secondary | ICD-10-CM

## 2014-07-10 DIAGNOSIS — N529 Male erectile dysfunction, unspecified: Secondary | ICD-10-CM

## 2014-07-10 DIAGNOSIS — K219 Gastro-esophageal reflux disease without esophagitis: Secondary | ICD-10-CM

## 2014-07-10 DIAGNOSIS — E785 Hyperlipidemia, unspecified: Secondary | ICD-10-CM

## 2014-07-10 DIAGNOSIS — I10 Essential (primary) hypertension: Secondary | ICD-10-CM

## 2014-07-10 MED ORDER — IBUPROFEN 800 MG PO TABS: 800.0000 mg | ORAL_TABLET | Freq: Three times a day (TID) | ORAL | Status: DC | PRN

## 2014-07-10 MED ORDER — OMEPRAZOLE 20 MG PO CPDR: 20.0000 mg | DELAYED_RELEASE_CAPSULE | Freq: Every day | ORAL | Status: DC | PRN

## 2014-07-10 MED ORDER — METAXALONE 800 MG PO TABS: 800.0000 mg | ORAL_TABLET | Freq: Three times a day (TID) | ORAL | Status: DC

## 2014-07-10 NOTE — Progress Notes (Signed)
Pre visit review using our clinic review tool, if applicable. No additional management support is needed unless otherwise documented below in the visit note. 

## 2014-07-10 NOTE — Assessment & Plan Note (Signed)
Reviewed preventive care protocols, scheduled due services, and updated immunizations Discussed nutrition, exercise, diet, and healthy lifestyle.  Had influenza vaccination at work.

## 2014-07-10 NOTE — Assessment & Plan Note (Signed)
HDL decreased- should improve with increased exercise.

## 2014-07-10 NOTE — Progress Notes (Signed)
Subjective:    Patient ID: Joseph Church, male    DOB: Aug 23, 1961, 53 y.o.   MRN: [id number]  HPI  Very pleasant 46 male here  For CPX.    Doing well- working quite a bit.  Has not had time to exercise much. Wants to get back into this--his schedule will improve in 07/2014.  Neck pain- MRI last month concerning- edema of c spine.  Saw Dr. Trenton Gammon who referred him to Dr. Maryjean Ka- unfortunately lidocaine injection was not helpful.  Skelaxin and Motrin seem to be more helpful.  HTN- on Benicar 20 mg daily.  BP has been well controlled on this. No CP, SOB or LE edema.  BP was not controlled on amlodipine and metoprolol worsened ED.  Lab Results  Component Value Date   CREATININE 1.1 07/03/2014    HLD- on Crestor 40 mg qhs.    Denies myalgias. Lab Results  Component Value Date   CHOL 155 07/03/2014   HDL 34.30* 07/03/2014   LDLCALC 81 07/03/2014   TRIG 198.0* 07/03/2014   CHOLHDL 5 07/03/2014    Lab Results  Component Value Date   PSA 0.61 07/03/2014   PSA 0.57 07/07/2013    Insomnia- takes Ambien approx 20 out of 30 days.  Does not take on weekends or nights that he does not have to work.  Could not tolerate antihistamines or lunesta.  GERD- on Zantac and Omeprazole.  Non smoker. Does drink energy drinks.  UTD colonoscopy- 2012- cleared for 10 years.  ED- takes daily cialis without side effects.  He is pleased with results.    Has appt with derm next week.    Patient Active Problem List   Diagnosis Date Noted  . Visit for well man health check 07/10/2014  . Neck pain 06/07/2014  . Chest pain 01/14/2014  . Insomnia 07/07/2013  . Erectile dysfunction 07/07/2013  . GERD (gastroesophageal reflux disease)   . Hyperlipidemia   . Hypertension    Past Medical History  Diagnosis Date  . GERD (gastroesophageal reflux disease)   . Hyperlipidemia   . Hypertension    Past Surgical History  Procedure Laterality Date  . Cholecystectomy, laparoscopic  2000  .  Tonsillectomy and adenoidectomy  1960's  . Vasectomy  1987  . Vasectomy reversal  1994  . Keratotomy  1995  . Cardiac catheterization      [facility];[place]   History  Substance Use Topics  . Smoking status: Never Smoker   . Smokeless tobacco: Not on file  . Alcohol Use: 5.0 oz/week    10 drink(s) per week     Comment: daily   Family History  Problem Relation Age of Onset  . Cancer Mother   . Cancer Father     lymphoma  . Hypertension Father   . Hyperlipidemia Father    No Known Allergies Current Outpatient Prescriptions on File Prior to Visit  Medication Sig Dispense Refill  . aspirin 81 MG tablet Take 81 mg by mouth daily.      Marland Kitchen olmesartan (BENICAR) 20 MG tablet Take 1 tablet (20 mg total) by mouth daily.  90 tablet  3  . ranitidine (ZANTAC) 150 MG capsule Take 1 capsule (150 mg total) by mouth 2 (two) times daily as needed for heartburn.  90 capsule  3  . rosuvastatin (CRESTOR) 40 MG tablet Take 1 tablet (40 mg total) by mouth at bedtime.  90 tablet  3  . tadalafil (CIALIS) 5 MG tablet Take  1 tablet (5 mg total) by mouth daily.  10 tablet  3  . zolpidem (AMBIEN) 10 MG tablet TAKE ONE TABLET BY MOUTH AT BEDTIME AS NEEDED FOR SLEEP  30 tablet  3   No current facility-administered medications on file prior to visit.   The PMH, PSH, Social History, Family History, Medications, and allergies have been reviewed in [facility], and have been updated if relevant.  Review of Systems  Constitutional: Negative.   HENT: Negative.   Eyes: Negative.   Respiratory: Negative.   Cardiovascular: Negative.   Endocrine: Negative.   Genitourinary: Negative for dysuria, urgency, frequency and difficulty urinating.  Musculoskeletal: Negative.   Allergic/Immunologic: Negative.   Neurological: Negative.   Hematological: Negative.   Psychiatric/Behavioral: Negative.       See HPI  Objective:   Physical Exam BP 110/68  Pulse 77  Temp(Src) 98 F (36.7 C)  (Oral)  Ht 6' 0.75" (1.848 m)  Wt 222 lb 12 oz (101.039 kg)  BMI 29.59 kg/m2  SpO2 96% General:  pleasant male in NAD Eyes:  PERRL Ears:  External ear exam shows no significant lesions or deformities.  Otoscopic examination reveals clear canals, tympanic membranes are intact bilaterally without bulging, retraction, inflammation or discharge. Hearing is grossly normal bilaterally. Nose:  External nasal examination shows no deformity or inflammation. Nasal mucosa are pink and moist without lesions or exudates. Mouth:  Oral mucosa and oropharynx without lesions or exudates.  Teeth in good repair. Neck:  no carotid bruit or thyromegaly no cervical or supraclavicular lymphadenopathy  Lungs:  Normal respiratory effort, chest expands symmetrically. Lungs are clear to auscultation, no crackles or wheezes. Heart:  Normal rate and regular rhythm. S1 and S2 normal without gallop, murmur, click, rub or other extra sounds. Abdomen:  Bowel sounds positive,abdomen soft and non-tender without masses, organomegaly or hernias noted. Pulses:  R and L posterior tibial pulses are full and equal bilaterally  Extremities:  no edema    Assessment & Plan:

## 2014-07-10 NOTE — Assessment & Plan Note (Signed)
Stable on prn ambien. He is aware of sedation and addiction potential.

## 2014-07-10 NOTE — Assessment & Plan Note (Signed)
Well controlled on current rx. No changes made today. 

## 2014-07-10 NOTE — Assessment & Plan Note (Signed)
He is pleased with cialis 5 mg daily. No changes made today.

## 2014-07-11 ENCOUNTER — Other Ambulatory Visit: Payer: Self-pay | Admitting: Family Medicine

## 2014-07-18 ENCOUNTER — Encounter: Payer: Self-pay | Admitting: Family Medicine

## 2014-07-18 NOTE — Telephone Encounter (Signed)
Pt requesting to restart med. Advised Dr Deborra Medina out of office, and may need an OV.

## 2014-07-25 ENCOUNTER — Encounter: Payer: Self-pay | Admitting: Family Medicine

## 2014-07-26 ENCOUNTER — Other Ambulatory Visit: Payer: Self-pay | Admitting: Family Medicine

## 2014-07-26 MED ORDER — FLUOXETINE HCL 20 MG PO TABS: 20.0000 mg | ORAL_TABLET | Freq: Every day | ORAL | Status: DC

## 2014-08-07 ENCOUNTER — Other Ambulatory Visit: Payer: Self-pay | Admitting: Family Medicine

## 2014-08-07 NOTE — Telephone Encounter (Signed)
Rx called in to requested pharmacy 

## 2014-08-07 NOTE — Telephone Encounter (Signed)
Pt requesting medication refill. Last f/u appt 06/2014-CPE. pls advise 

## 2014-10-18 ENCOUNTER — Other Ambulatory Visit: Payer: Self-pay | Admitting: Family Medicine

## 2014-10-18 NOTE — Telephone Encounter (Signed)
Last f/u visit 06/2014. pls advise

## 2014-10-18 NOTE — Telephone Encounter (Signed)
Rx called in to requested pharmacy 

## 2014-12-27 ENCOUNTER — Other Ambulatory Visit: Payer: Self-pay | Admitting: Family Medicine

## 2014-12-27 NOTE — Telephone Encounter (Signed)
Last f/u appt 06/2014 

## 2014-12-27 NOTE — Telephone Encounter (Signed)
Rx called in to requested pharmacy 

## 2015-02-05 ENCOUNTER — Encounter: Payer: Self-pay | Admitting: Family Medicine

## 2015-02-06 MED ORDER — TADALAFIL 5 MG PO TABS: 5.0000 mg | ORAL_TABLET | Freq: Every day | ORAL | Status: DC

## 2015-02-06 MED ORDER — OLMESARTAN MEDOXOMIL 20 MG PO TABS: 20.0000 mg | ORAL_TABLET | Freq: Every day | ORAL | Status: DC

## 2015-02-06 MED ORDER — ZOLPIDEM TARTRATE 10 MG PO TABS: 10.0000 mg | ORAL_TABLET | Freq: Every evening | ORAL | Status: DC | PRN

## 2015-02-06 MED ORDER — ROSUVASTATIN CALCIUM 40 MG PO TABS: 40.0000 mg | ORAL_TABLET | Freq: Every day | ORAL | Status: DC

## 2015-02-06 MED ORDER — RANITIDINE HCL 150 MG PO CAPS: ORAL_CAPSULE | ORAL | Status: DC

## 2015-02-06 MED ORDER — FLUOXETINE HCL 20 MG PO TABS: 20.0000 mg | ORAL_TABLET | Freq: Every day | ORAL | Status: DC

## 2015-02-06 NOTE — Telephone Encounter (Signed)
Last f/u appt 06/2014 

## 2015-02-06 NOTE — Telephone Encounter (Signed)
Rx called in to requested pharmacy 

## 2015-02-08 ENCOUNTER — Telehealth: Payer: Self-pay

## 2015-02-08 NOTE — Telephone Encounter (Signed)
Pt left note that Cialis was sent for # 9 only and needs # 30; spoke with Langley Gauss at [place] and was advised received # 68 but ins will only cover # 9 for 1 month. Pt also request urgent PA for crestor and benicar sent to [facility] rx [phone]' Langley Gauss said Crestor and Benicar note when try to run thru computer is Not covered meds; does not ask for PA. Spoke with pt and pt said he talked with ins co and was advised a PA was needed for crestor and benicar so pt could continue those meds. Pt also request quantity exception for cialis. Pt request cb when completed.

## 2015-02-08 NOTE — Telephone Encounter (Signed)
Spoke to pt and informed him that I spoke with Timmothy Sours at [place], who confirmed message below. Per Timmothy Sours, pts insurance will only cover #9 of Cialis for $71.00, no exception. A quantity limit override is not available. Timmothy Sours also confirmed there is no option for PA for pts Benicar or Crestor. Those medications can only be filled at one of eight [facility] ([place] pharmacies, including [facility] in [place] being the only in this area. Pt states that he is not driving to [place] for #30 of tabs, as his insurance has also indicated there is no #90 of meds allowed. Pt reports that he will contact office back after "thinking it over" to determine if he will even continue taking medications. Offered to have alternate medication requested and prescribed; pt refused

## 2015-02-21 ENCOUNTER — Ambulatory Visit (INDEPENDENT_AMBULATORY_CARE_PROVIDER_SITE_OTHER): Payer: PRIVATE HEALTH INSURANCE | Admitting: Family Medicine

## 2015-02-21 ENCOUNTER — Encounter: Payer: Self-pay | Admitting: Family Medicine

## 2015-02-21 VITALS — BP 122/62 | HR 76 | Temp 97.7°F | Wt 222.0 lb

## 2015-02-21 DIAGNOSIS — L237 Allergic contact dermatitis due to plants, except food: Secondary | ICD-10-CM

## 2015-02-21 MED ORDER — METHYLPREDNISOLONE 4 MG PO TBPK: ORAL_TABLET | ORAL | Status: DC

## 2015-02-21 MED ORDER — DEXAMETHASONE SODIUM PHOSPHATE 10 MG/ML IJ SOLN
10.0000 mg | Freq: Once | INTRAMUSCULAR | Status: AC
  Administered 2015-02-21: 10 mg via INTRAMUSCULAR

## 2015-02-21 NOTE — Progress Notes (Signed)
Subjective:   Patient ID: Joseph Church, male    DOB: [birth year], 54 y.o.   MRN: [id number]  Joseph Church is a pleasant 54 y.o. year old male who presents to clinic today with Rash  on 02/21/2015  HPI:  Was weeding eating in shorts two days ago.  Itchy rash started on left lateral ankle, has since spread up left leg and to right leg.  No other areas affected yet. No fevers, chills or weeping.  Topical hydrocortisone only mildly effective.  H/o poison ivy - "almost every summer."  Now working as a PA at [employer].  Current Outpatient Prescriptions on File Prior to Visit  Medication Sig Dispense Refill  . aspirin 81 MG tablet Take 81 mg by mouth daily.    Marland Kitchen FLUoxetine (PROZAC) 20 MG tablet Take 1 tablet (20 mg total) by mouth daily. 90 tablet 0  . ibuprofen (ADVIL,MOTRIN) 800 MG tablet Take 1 tablet (800 mg total) by mouth every 8 (eight) hours as needed. 90 tablet 0  . metaxalone (SKELAXIN) 800 MG tablet Take 1 tablet (800 mg total) by mouth 3 (three) times daily. 90 tablet 0  . omeprazole (PRILOSEC) 20 MG capsule Take 1 capsule (20 mg total) by mouth daily as needed. 90 capsule 3  . ranitidine (ZANTAC) 150 MG capsule Take 1 capsule (150 mg total) by mouth 2 (two) times daily as needed for heartburn. 90 capsule 3  . tadalafil (CIALIS) 5 MG tablet Take 1 tablet (5 mg total) by mouth daily. 90 tablet 0  . zolpidem (AMBIEN) 10 MG tablet Take 1 tablet (10 mg total) by mouth at bedtime as needed. for sleep 30 tablet 1  . olmesartan (BENICAR) 20 MG tablet Take 1 tablet (20 mg total) by mouth daily. (Patient not taking: Reported on 02/21/2015) 90 tablet 3  . rosuvastatin (CRESTOR) 40 MG tablet Take 1 tablet (40 mg total) by mouth at bedtime. (Patient not taking: Reported on 02/21/2015) 30 tablet 0   No current facility-administered medications on file prior to visit.    No Known Allergies  Past Medical History  Diagnosis Date  . GERD (gastroesophageal reflux disease)   . Hyperlipidemia     . Hypertension     Past Surgical History  Procedure Laterality Date  . Cholecystectomy, laparoscopic  2000  . Tonsillectomy and adenoidectomy  1960's  . Vasectomy  1987  . Vasectomy reversal  1994  . Keratotomy  1995  . Cardiac catheterization      [facility]    Family History  Problem Relation Age of Onset  . Cancer Mother   . Cancer Father     lymphoma  . Hypertension Father   . Hyperlipidemia Father     History   Social History  . Marital Status: Divorced    Spouse Name: N/A  . Number of Children: N/A  . Years of Education: N/A   Occupational History  . Not on file.   Social History Main Topics  . Smoking status: Never Smoker   . Smokeless tobacco: Not on file  . Alcohol Use: 5.0 oz/week    10 drink(s) per week     Comment: daily  . Drug Use: No  . Sexual Activity: Not on file   Other Topics Concern  . Not on file   Social History Narrative   Physician's Assitant- travels for [employer]   Divorced 3 times   Has 5 children- two teenagers that live in [place]  The PMH, PSH, Social History, Family History, Medications, and allergies have been reviewed in [facility], and have been updated if relevant.   Review of Systems  Constitutional: Negative.   Cardiovascular: Negative.   Gastrointestinal: Negative.   Musculoskeletal: Negative.   Skin: Positive for rash.  Neurological: Negative.   Psychiatric/Behavioral: Negative.   All other systems reviewed and are negative.      Objective:    BP 122/62 mmHg  Pulse 76  Temp(Src) 97.7 F (36.5 C) (Oral)  Wt 222 lb (100.699 kg)  SpO2 97%   Physical Exam  Constitutional: He is oriented to person, place, and time. He appears well-developed and well-nourished. No distress.  HENT:  Head: Normocephalic.  Eyes: Conjunctivae are normal.  Cardiovascular: Normal rate.   Pulmonary/Chest: Effort normal.  Musculoskeletal: He exhibits no edema.  Neurological: He is alert and  oriented to person, place, and time. No cranial nerve deficit.  Skin: Skin is warm and dry. Rash noted. Rash is maculopapular and vesicular.  Psychiatric: He has a normal mood and affect. His behavior is normal. Thought content normal.  Nursing note and vitals reviewed.         Assessment & Plan:   Poison ivy dermatitis No Follow-up on file.

## 2015-02-21 NOTE — Patient Instructions (Signed)
Great to see you. Congratulations on your exciting changes!  Keep me updated.

## 2015-02-21 NOTE — Assessment & Plan Note (Signed)
New- very itchy/symptomatic. No signs on exam of superinfection. Treat with IM decadron in office, eRx sent for medrol dose pack that he may start in 48 hours if symptoms persist. The patient indicates understanding of these issues and agrees with the plan.

## 2015-02-21 NOTE — Progress Notes (Signed)
Pre visit review using our clinic review tool, if applicable. No additional management support is needed unless otherwise documented below in the visit note. 

## 2015-03-06 ENCOUNTER — Other Ambulatory Visit: Payer: Self-pay | Admitting: Family Medicine

## 2015-03-06 DIAGNOSIS — E785 Hyperlipidemia, unspecified: Secondary | ICD-10-CM

## 2015-03-06 DIAGNOSIS — I10 Essential (primary) hypertension: Secondary | ICD-10-CM

## 2015-03-13 ENCOUNTER — Other Ambulatory Visit (INDEPENDENT_AMBULATORY_CARE_PROVIDER_SITE_OTHER): Payer: PRIVATE HEALTH INSURANCE

## 2015-03-13 ENCOUNTER — Telehealth: Payer: Self-pay | Admitting: *Deleted

## 2015-03-13 ENCOUNTER — Telehealth: Payer: Self-pay

## 2015-03-13 DIAGNOSIS — E785 Hyperlipidemia, unspecified: Secondary | ICD-10-CM

## 2015-03-13 LAB — LIPID PANEL
Cholesterol: 295 mg/dL — ABNORMAL HIGH (ref 0–200)
HDL: 48.8 mg/dL (ref >39.00)
LDL Cholesterol: 215 mg/dL — ABNORMAL HIGH (ref 0–99)
NonHDL: 246.2
Total CHOL/HDL Ratio: 6
Triglycerides: 158 mg/dL — ABNORMAL HIGH (ref 0.0–149.0)
VLDL: 31.6 mg/dL (ref 0.0–40.0)

## 2015-03-13 LAB — COMPREHENSIVE METABOLIC PANEL
ALBUMIN: 3.9 g/dL (ref 3.5–5.2)
ALK PHOS: 54 U/L (ref 39–117)
ALT: 14 U/L (ref 0–53)
AST: 17 U/L (ref 0–37)
BILIRUBIN TOTAL: 0.7 mg/dL (ref 0.2–1.2)
BUN: 15 mg/dL (ref 6–23)
CO2: 26 mEq/L (ref 19–32)
CREATININE: 1.05 mg/dL (ref 0.40–1.50)
Calcium: 9.3 mg/dL (ref 8.4–10.5)
Chloride: 102 mEq/L (ref 96–112)
GFR: 78.04 mL/min (ref >60.00)
GLUCOSE: 99 mg/dL (ref 70–99)
Potassium: 4.2 mEq/L (ref 3.5–5.1)
Sodium: 136 mEq/L (ref 135–145)
TOTAL PROTEIN: 6.7 g/dL (ref 6.0–8.3)

## 2015-03-13 MED ORDER — FLUOXETINE HCL 20 MG PO TABS: 20.0000 mg | ORAL_TABLET | Freq: Every day | ORAL | Status: DC

## 2015-03-13 MED ORDER — ROSUVASTATIN CALCIUM 40 MG PO TABS: 40.0000 mg | ORAL_TABLET | Freq: Every day | ORAL | Status: DC

## 2015-03-13 MED ORDER — TADALAFIL 5 MG PO TABS: 5.0000 mg | ORAL_TABLET | Freq: Every day | ORAL | Status: DC

## 2015-03-13 NOTE — Telephone Encounter (Signed)
Pt has changed pharmacy to [address] [facility] [phone]. Same as [facility]. Pt advised refill done after approved by [facility] CMA.

## 2015-03-18 NOTE — Telephone Encounter (Signed)
Duplicate-opened in error. 

## 2015-03-27 ENCOUNTER — Other Ambulatory Visit: Payer: Self-pay | Admitting: Family Medicine

## 2015-03-27 NOTE — Telephone Encounter (Signed)
Dr. Deborra Medina last note reviewed, the tabs with refills she gave him should last until 05/09/15 unless he is using it more often. Will defer refill to PCP

## 2015-03-27 NOTE — Telephone Encounter (Signed)
Last f/u appt 06/2014-CPE

## 2015-03-27 NOTE — Telephone Encounter (Signed)
Spoke to pt who thinks he may have called in Rx from incorrect bottle.

## 2015-03-27 NOTE — Telephone Encounter (Signed)
Agree with Rollene Fare. Why is he asking for refill early?

## 2015-05-09 ENCOUNTER — Encounter: Payer: Self-pay | Admitting: Family Medicine

## 2015-05-09 MED ORDER — ZOLPIDEM TARTRATE 10 MG PO TABS: 10.0000 mg | ORAL_TABLET | Freq: Every evening | ORAL | Status: DC | PRN

## 2015-05-09 NOTE — Telephone Encounter (Signed)
Rx called in to requested pharmacy 

## 2015-05-09 NOTE — Telephone Encounter (Signed)
Last f/u appt 06/2014-CPE

## 2015-05-13 ENCOUNTER — Encounter: Payer: Self-pay | Admitting: Family Medicine

## 2015-05-13 ENCOUNTER — Other Ambulatory Visit (INDEPENDENT_AMBULATORY_CARE_PROVIDER_SITE_OTHER): Payer: PRIVATE HEALTH INSURANCE

## 2015-05-13 ENCOUNTER — Ambulatory Visit (INDEPENDENT_AMBULATORY_CARE_PROVIDER_SITE_OTHER): Payer: PRIVATE HEALTH INSURANCE | Admitting: Family Medicine

## 2015-05-13 VITALS — BP 122/72 | HR 89 | Ht 73.0 in | Wt 222.0 lb

## 2015-05-13 DIAGNOSIS — K635 Polyp of colon: Secondary | ICD-10-CM | POA: Insufficient documentation

## 2015-05-13 DIAGNOSIS — M67912 Unspecified disorder of synovium and tendon, left shoulder: Secondary | ICD-10-CM | POA: Diagnosis not present

## 2015-05-13 DIAGNOSIS — M25512 Pain in left shoulder: Secondary | ICD-10-CM | POA: Diagnosis not present

## 2015-05-13 MED ORDER — DICLOFENAC SODIUM 2 % TD SOLN: TRANSDERMAL | Status: DC

## 2015-05-13 NOTE — Progress Notes (Signed)
Pre visit review using our clinic review tool, if applicable. No additional management support is needed unless otherwise documented below in the visit note. 

## 2015-05-13 NOTE — Progress Notes (Signed)
[facility] [place] [place], [place] [address] Phone: [phone] Subjective:    I'm seeing this patient by the request  of:  Arnette Norris, MD   CC: Shoulder pain left  [id number] Joseph Church is a 54 y.o. male coming in with complaint of left shoulder pain. Has been 4 weeks. Patient was moving boxes and slipped having to catch himself. Patient felt that was significant he wears previously. States since then unfortunately continues to have the pain. States that certain range of motion activities especially reaching across his body is severely tender mostly on the anterior aspect of the shoulder. Denies any radiation down the arm or any numbness or weakness. States that it does keep him up at night. Rates the severity of pain most of time as 2 out of 10 but sometimes can get as bad is 8 out of 10. Describes it as a sharp stabbing pain that then seems to resolve on its own. Usually within seconds.  Past Medical History  Diagnosis Date  . GERD (gastroesophageal reflux disease)   . Hyperlipidemia   . Hypertension    Past Surgical History  Procedure Laterality Date  . Cholecystectomy, laparoscopic  2000  . Tonsillectomy and adenoidectomy  1960's  . Vasectomy  1987  . Vasectomy reversal  1994  . Keratotomy  1995  . Cardiac catheterization      [facility];[place]   Social History  Substance Use Topics  . Smoking status: Never Smoker   . Smokeless tobacco: None  . Alcohol Use: 5.0 oz/week    10 drink(s) per week     Comment: daily   Allergies  Allergen Reactions  . Lisinopril Cough   Family History  Problem Relation Age of Onset  . Cancer Mother   . Cancer Father     lymphoma  . Hypertension Father   . Hyperlipidemia Father         Past medical history, social, surgical and family history all reviewed in electronic medical record.   Review of Systems: No headache, visual changes, nausea, vomiting,  diarrhea, constipation, dizziness, abdominal pain, skin rash, fevers, chills, night sweats, weight loss, swollen lymph nodes, body aches, joint swelling, muscle aches, chest pain, shortness of breath, mood changes.   Objective Blood pressure 122/72, pulse 89, height 6\' 1"  (1.854 m), weight 222 lb (100.699 kg), SpO2 96 %.  General: No apparent distress alert and oriented x3 mood and affect normal, dressed appropriately.  HEENT: Pupils equal, extraocular movements intact  Respiratory: Patient's speak in full sentences and does not appear short of breath  Cardiovascular: No lower extremity edema, non tender, no erythema  Skin: Warm dry intact with no signs of infection or rash on extremities or on axial skeleton.  Abdomen: Soft nontender  Neuro: Cranial nerves II through XII are intact, neurovascularly intact in all extremities with 2+ DTRs and 2+ pulses.  Lymph: No lymphadenopathy of posterior or anterior cervical chain or axillae bilaterally.  Gait normal with good balance and coordination.  MSK:  Non tender with full range of motion and good stability and symmetric strength and tone of  elbows, wrist, hip, knee and ankles bilaterally.  Shoulder: left Inspection reveals no abnormalities, atrophy or asymmetry. Palpation is normal with no tenderness over AC joint or bicipital groove. ROM is full in all planes passively. Rotator cuff strength normal throughout. signs of impingement with positive Neer and Hawkin's tests, but negative empty can sign. Speeds and [employer]  tests normal. No labral pathology noted with negative Obrien's, negative clunk and good stability. Normal scapular function observed. No painful arc and no drop arm sign. No apprehension sign  MSK US performed of: left This study was ordered, performed, and interpreted by Charlann Boxer D.O.  Shoulder:   Supraspinatus:  Appears normal on long and transverse views, Bursal bulge seen with shoulder abduction on impingement  view. Infraspinatus:  Appears normal on long and transverse views. Significant increase in Doppler flow Subscapularis:  Appears normal on long and transverse views. Positive bursa Teres Minor:  Appears normal on long and transverse views. AC joint:  Capsule undistended, no geyser sign. Glenohumeral Joint:  Appears normal without effusion. Glenoid Labrum:  Intact without visualized tears. Biceps Tendon:  Appears normal on long and transverse views, no fraying of tendon, tendon located in intertubercular groove, no subluxation with shoulder internal or external rotation.  Impression: Subacromial bursitis, mild rotator cuff tendinopathy  Procedure: Real-time Ultrasound Guided Injection of left glenohumeral joint Device: GE Logiq E  Ultrasound guided injection is preferred based studies that show increased duration, increased effect, greater accuracy, decreased procedural pain, increased response rate with ultrasound guided versus blind injection.  Verbal informed consent obtained.  Time-out conducted.  Noted no overlying erythema, induration, or other signs of local infection.  Skin prepped in a sterile fashion.  Local anesthesia: Topical Ethyl chloride.  With sterile technique and under real time ultrasound guidance:  Joint visualized.  23g 1  inch needle inserted posterior approach. Pictures taken for needle placement. Patient did have injection of 2 cc of 1% lidocaine, 2 cc of 0.5% Marcaine, and 1.0 cc of Kenalog 40 mg/dL. Completed without difficulty  Pain immediately resolved suggesting accurate placement of the medication.  Advised to call if fevers/chills, erythema, induration, drainage, or persistent bleeding.  Images permanently stored and available for review in the ultrasound unit.  Impression: Technically successful ultrasound guided injection.  Procedure note 94709; 15 minutes spent for Therapeutic exercises as stated in above notes.  This included exercises focusing on  stretching, strengthening, with significant focus on eccentric aspects.  Shoulder Exercises that included:  Basic scapular stabilization to include adduction and depression of scapula Scaption, focusing on proper movement and good control Internal and External rotation utilizing a theraband, with elbow tucked at side entire time Rows with theraband Proper technique shown and discussed handout in great detail with ATC.  All questions were discussed and answered.      Impression and Recommendations:     This case required medical decision making of moderate complexity.

## 2015-05-13 NOTE — Assessment & Plan Note (Signed)
Patient given an injection today and tolerated the procedure well. We discussed icing regimen and home exercises. We discussed which activities to do in which ones to potentially avoid. We discussed medications could be beneficial. Patient has not responded well to the muscle relaxers. Patient given a topical anti-inflammatory prescription sent in today. We'll avoid oral's second due to his gastroesophageal reflux disease. Patient and will come back and see me again in 3 weeks. Continuing to have difficulty we may need to consider further imaging.  I do not think that this will be necessary.

## 2015-05-13 NOTE — Patient Instructions (Addendum)
Good to see you Ice 20 minutes 2 times daily. Usually after activity and before bed. Exercises 3 times a week.  Avoid any movement with hands outside peripheral vision.  pennsaid pinkie amount topically 2 times daily as needed.  See me again in 3 weeks if not perfect otherwise congrats!

## 2015-05-19 ENCOUNTER — Ambulatory Visit
Admission: EM | Admit: 2015-05-19 | Discharge: 2015-05-19 | Disposition: A | Discharge status: Home / Self Care | Payer: PRIVATE HEALTH INSURANCE | Attending: Family Medicine | Admitting: Family Medicine

## 2015-05-19 DIAGNOSIS — L259 Unspecified contact dermatitis, unspecified cause: Secondary | ICD-10-CM | POA: Diagnosis not present

## 2015-05-19 DIAGNOSIS — R21 Rash and other nonspecific skin eruption: Secondary | ICD-10-CM

## 2015-05-19 MED ORDER — METHYLPREDNISOLONE SODIUM SUCC 125 MG IJ SOLR
125.0000 mg | Freq: Once | INTRAMUSCULAR | Status: AC
  Administered 2015-05-19: 125 mg via INTRAMUSCULAR

## 2015-05-19 MED ORDER — PREDNISONE 10 MG PO TABS: ORAL_TABLET | ORAL | Status: DC

## 2015-05-19 NOTE — Discharge Instructions (Signed)
Take medication as prescribed. Take over the counter Benadryl as needed for itching. Return to Urgent care for new or worsening concerns.   Contact Dermatitis Contact dermatitis is a reaction to certain substances that touch the skin. Contact dermatitis can be either irritant contact dermatitis or allergic contact dermatitis. Irritant contact dermatitis does not require previous exposure to the substance for a reaction to occur.Allergic contact dermatitis only occurs if you have been exposed to the substance before. Upon a repeat exposure, your body reacts to the substance.  CAUSES  Many substances can cause contact dermatitis. Irritant dermatitis is most commonly caused by repeated exposure to mildly irritating substances, such as:  Makeup.  Soaps.  Detergents.  Bleaches.  Acids.  Metal salts, such as nickel. Allergic contact dermatitis is most commonly caused by exposure to:  Poisonous plants.  Chemicals (deodorants, shampoos).  Jewelry.  Latex.  Neomycin in triple antibiotic cream.  Preservatives in products, including clothing. SYMPTOMS  The area of skin that is exposed may develop:  Dryness or flaking.  Redness.  Cracks.  Itching.  Pain or a burning sensation.  Blisters. With allergic contact dermatitis, there may also be swelling in areas such as the eyelids, mouth, or genitals.  DIAGNOSIS  Your caregiver can usually tell what the problem is by doing a physical exam. In cases where the cause is uncertain and an allergic contact dermatitis is suspected, a patch skin test may be performed to help determine the cause of your dermatitis. TREATMENT Treatment includes protecting the skin from further contact with the irritating substance by avoiding that substance if possible. Barrier creams, powders, and gloves may be helpful. Your caregiver may also recommend:  Steroid creams or ointments applied 2 times daily. For best results, soak the rash area in cool water  for 20 minutes. Then apply the medicine. Cover the area with a plastic wrap. You can store the steroid cream in the refrigerator for a "chilly" effect on your rash. That may decrease itching. Oral steroid medicines may be needed in more severe cases.  Antibiotics or antibacterial ointments if a skin infection is present.  Antihistamine lotion or an antihistamine taken by mouth to ease itching.  Lubricants to keep moisture in your skin.  Burow's solution to reduce redness and soreness or to dry a weeping rash. Mix one packet or tablet of solution in 2 cups cool water. Dip a clean washcloth in the mixture, wring it out a bit, and put it on the affected area. Leave the cloth in place for 30 minutes. Do this as often as possible throughout the day.  Taking several cornstarch or baking soda baths daily if the area is too large to cover with a washcloth. Harsh chemicals, such as alkalis or acids, can cause skin damage that is like a burn. You should flush your skin for 15 to 20 minutes with cold water after such an exposure. You should also seek immediate medical care after exposure. Bandages (dressings), antibiotics, and pain medicine may be needed for severely irritated skin.  HOME CARE INSTRUCTIONS  Avoid the substance that caused your reaction.  Keep the area of skin that is affected away from hot water, soap, sunlight, chemicals, acidic substances, or anything else that would irritate your skin.  Do not scratch the rash. Scratching may cause the rash to become infected.  You may take cool baths to help stop the itching.  Only take over-the-counter or prescription medicines as directed by your caregiver.  See your caregiver for follow-up  care as directed to make sure your skin is healing properly. SEEK MEDICAL CARE IF:   Your condition is not better after 3 days of treatment.  You seem to be getting worse.  You see signs of infection such as swelling, tenderness, redness, soreness, or  warmth in the affected area.  You have any problems related to your medicines. Document Released: 09/04/2000 Document Revised: 11/30/2011 Document Reviewed: 02/10/2011 [facility] Patient Information 2015 [place], [place]. This information is not intended to replace advice given to you by your health care provider. Make sure you discuss any questions you have with your health care provider.  Poison [employer] ivy is a inflammation of the skin (contact dermatitis) caused by touching the allergens on the leaves of the ivy plant following previous exposure to the plant. The rash usually appears 48 hours after exposure. The rash is usually bumps (papules) or blisters (vesicles) in a linear pattern. Depending on your own sensitivity, the rash may simply cause redness and itching, or it may also progress to blisters which may break open. These must be well cared for to prevent secondary bacterial (germ) infection, followed by scarring. Keep any open areas dry, clean, dressed, and covered with an antibacterial ointment if needed. The eyes may also get puffy. The puffiness is worst in the morning and gets better as the day progresses. This dermatitis usually heals without scarring, within 2 to 3 weeks without treatment. HOME CARE INSTRUCTIONS  Thoroughly wash with soap and water as soon as you have been exposed to poison ivy. You have about one half hour to remove the plant resin before it will cause the rash. This washing will destroy the oil or antigen on the skin that is causing, or will cause, the rash. Be sure to wash under your fingernails as any plant resin there will continue to spread the rash. Do not rub skin vigorously when washing affected area. Poison ivy cannot spread if no oil from the plant remains on your body. A rash that has progressed to weeping sores will not spread the rash unless you have not washed thoroughly. It is also important to wash any clothes you have been wearing as these may carry  active allergens. The rash will return if you wear the unwashed clothing, even several days later. Avoidance of the plant in the future is the best measure. Poison ivy plant can be recognized by the number of leaves. Generally, poison ivy has three leaves with flowering branches on a single stem. Diphenhydramine may be purchased over the counter and used as needed for itching. Do not drive with this medication if it makes you drowsy.Ask your caregiver about medication for children. SEEK MEDICAL CARE IF:  Open sores develop.  Redness spreads beyond area of rash.  You notice purulent (pus-like) discharge.  You have increased pain.  Other signs of infection develop (such as fever). Document Released: 09/04/2000 Document Revised: 11/30/2011 Document Reviewed: 02/15/2009 [facility] Patient Information 2015 [place], [place]. This information is not intended to replace advice given to you by your health care provider. Make sure you discuss any questions you have with your health care provider.

## 2015-05-19 NOTE — ED Provider Notes (Signed)
[facility] Emergency Department Provider Note  ____________________________________________  Time seen: Approximately 12:00 PM  I have reviewed the triage vital signs and the nursing notes.   HISTORY  Chief Complaint Poison Ivy   HPI Joseph Church is a 54 y.o. male presents with complaints of itchy rash. Patient reports that this past week he was pulling vines out of his rosebushes. States multiple scratches to arms from roses. Patient states that he was possibly exposed to poison oak or poison ivy. States itchy rash to bilateral forearms 4 days and gradually progressing and spreading. States unrelieved with over the counter benadryl and creams.   Denies fevers, shortness of breath, chest pain, facial swelling. Reports continues to eat and drink well. Denies other complaints.   Past Medical History  Diagnosis Date  . GERD (gastroesophageal reflux disease)   . Hyperlipidemia   . Hypertension     Patient Active Problem List   Diagnosis Date Noted  . Benign colon polyp 05/13/2015  . Tendinopathy of left rotator cuff 05/13/2015  . Poison ivy dermatitis 02/21/2015  . Visit for well man health check 07/10/2014  . Cervical spine pain 07/10/2014  . Neck pain 06/07/2014  . Insomnia 07/07/2013  . Erectile dysfunction 07/07/2013  . GERD (gastroesophageal reflux disease)   . Hyperlipidemia   . Hypertension     Past Surgical History  Procedure Laterality Date  . Cholecystectomy, laparoscopic  2000  . Tonsillectomy and adenoidectomy  1960's  . Vasectomy  1987  . Vasectomy reversal  1994  . Keratotomy  1995  . Cardiac catheterization      [facility]    Current Outpatient Rx  Name  Route  Sig  Dispense  Refill  . aspirin 81 MG tablet   Oral   Take 81 mg by mouth daily.         . Diclofenac Sodium 2 % SOLN      Apply 1 pump twice daily.   112 g   3     [id number] (M)   . FLUoxetine (PROZAC) 20 MG  tablet   Oral   Take 1 tablet (20 mg total) by mouth daily.   90 tablet   0   . ibuprofen (ADVIL,MOTRIN) 800 MG tablet   Oral   Take 1 tablet (800 mg total) by mouth every 8 (eight) hours as needed.   90 tablet   0   . metaxalone (SKELAXIN) 800 MG tablet   Oral   Take 1 tablet (800 mg total) by mouth 3 (three) times daily.   90 tablet   0   . olmesartan (BENICAR) 20 MG tablet   Oral   Take 1 tablet (20 mg total) by mouth daily.   90 tablet   3   . omeprazole (PRILOSEC) 20 MG capsule   Oral   Take 1 capsule (20 mg total) by mouth daily as needed.   90 capsule   3   . ranitidine (ZANTAC) 150 MG capsule      Take 1 capsule (150 mg total) by mouth 2 (two) times daily as needed for heartburn.   90 capsule   3   . rosuvastatin (CRESTOR) 40 MG tablet   Oral   Take 1 tablet (40 mg total) by mouth at bedtime.   90 tablet   1   . tadalafil (CIALIS) 5 MG tablet   Oral   Take 1 tablet (5 mg total) by mouth daily.   90 tablet  1   . zolpidem (AMBIEN) 10 MG tablet   Oral   Take 1 tablet (10 mg total) by mouth at bedtime as needed. for sleep   30 tablet   0   .         0     Allergies Lisinopril  Family History  Problem Relation Age of Onset  . Cancer Mother   . Cancer Father     lymphoma  . Hypertension Father   . Hyperlipidemia Father     Social History Social History  Substance Use Topics  . Smoking status: Never Smoker   . Smokeless tobacco: None  . Alcohol Use: 5.0 oz/week    10 drink(s) per week     Comment: daily    Review of Systems Constitutional: No fever/chills Eyes: No visual changes. ENT: No sore throat. Cardiovascular: Denies chest pain. Respiratory: Denies shortness of breath. Gastrointestinal: No abdominal pain.  No nausea, no vomiting.  No diarrhea.  No constipation. Genitourinary: Negative for dysuria. Musculoskeletal: Negative for back pain. Skin: positive for rash. Neurological: Negative for headaches, focal weakness or  numbness.  10-point ROS otherwise negative.  ____________________________________________   PHYSICAL EXAM:  VITAL SIGNS: ED Triage Vitals  Enc Vitals Group     BP 05/19/15 1129 129/80 mmHg     Pulse Rate 05/19/15 1129 77     Resp 05/19/15 1129 16     Temp 05/19/15 1129 97.3 F (36.3 C)     Temp Source 05/19/15 1129 Oral     SpO2 05/19/15 1129 98 %     Weight 05/19/15 1129 220 lb (99.791 kg)     Height 05/19/15 1129 6\' 1"  (1.854 m)     Head Cir --      Peak Flow --      Pain Score --      Pain Loc --      Pain Edu? --      Excl. in [place]? --     Constitutional: Alert and oriented. Well appearing and in no acute distress. Eyes: Conjunctivae are normal. PERRL. EOMI. Head: Atraumatic.  Mouth/Throat: Mucous membranes are moist.  No  Neck: No stridor.  No cervical spine tenderness to palpation. Hematological/Lymphatic/Immunilogical: No cervical lymphadenopathy. Cardiovascular: Normal rate, regular rhythm. Grossly normal heart sounds.  Respiratory: Normal respiratory effort.  No retractions. Lungs CTAB. Musculoskeletal: No lower or upper extremity tenderness nor edema.   Neurologic:  Normal speech and language. No gross focal neurologic deficits are appreciated. No gait instability. Skin:  Skin is warm, dry and intact. Superficial abrasions scattered to bilateral forearms. Bilateral forearms and upper arms with clustered pruritic mildly erythematic vesicular rash. No surrounding erythema, induration. No drainage. Full ROM, nontender upper extremities. No other rash. No facial rash. Psychiatric: Mood and affect are normal. Speech and behavior are normal.  ____________________________________________   LABS (all labs ordered are listed, but only abnormal results are displayed)  Labs Reviewed - No data to display ________________________________________   INITIAL IMPRESSION / ASSESSMENT AND PLAN / ED COURSE  Pertinent labs & imaging results that were available during my care  of the patient were reviewed by me and considered in my medical decision making (see chart for details).  Very well-appearing patient. No acute distress. Presents for pruritic rash to bilateral forearms and arms 4 days. Reports recent exposure to vines and other plants while pulling weeds from rose bushes. Denies other complaints. Will treat with prednisone taper over 12 days. Solu-Medrol 125 IM once. Discussed strict follow-up  and return parameters. Patient verbalized understanding and agreed to plan. ____________________________________________   FINAL CLINICAL IMPRESSION(S) / ED DIAGNOSES  Final diagnoses:  Contact dermatitis  Rash       Marylene Land, NP 05/19/15 1229

## 2015-05-19 NOTE — ED Notes (Signed)
Cleaning brush 4 days ago. Itchy rash bilateral forearms

## 2015-05-29 ENCOUNTER — Other Ambulatory Visit: Payer: Self-pay | Admitting: Family Medicine

## 2015-05-29 MED ORDER — FLUOXETINE HCL 20 MG PO TABS: 20.0000 mg | ORAL_TABLET | Freq: Every day | ORAL | Status: DC

## 2015-05-29 NOTE — Addendum Note (Signed)
Addended by: Modena Nunnery on: 05/29/2015 11:50 AM   Modules accepted: Orders

## 2015-06-03 ENCOUNTER — Ambulatory Visit: Payer: PRIVATE HEALTH INSURANCE | Admitting: Family Medicine

## 2015-06-05 ENCOUNTER — Encounter: Payer: Self-pay | Admitting: Family Medicine

## 2015-06-05 ENCOUNTER — Telehealth: Payer: Self-pay | Admitting: Family Medicine

## 2015-06-05 NOTE — Telephone Encounter (Signed)
Spoke to pt, scheduled him for Friday @ 10am

## 2015-06-05 NOTE — Telephone Encounter (Signed)
Patient is scheduled for 06/26/2015 for a new issue, but he is getting married this weekend. He was wondering if we could fir him in. Please give him a call when you can. Thank you.

## 2015-06-07 ENCOUNTER — Other Ambulatory Visit (INDEPENDENT_AMBULATORY_CARE_PROVIDER_SITE_OTHER): Payer: PRIVATE HEALTH INSURANCE

## 2015-06-07 ENCOUNTER — Encounter: Payer: Self-pay | Admitting: Family Medicine

## 2015-06-07 ENCOUNTER — Ambulatory Visit (INDEPENDENT_AMBULATORY_CARE_PROVIDER_SITE_OTHER): Payer: PRIVATE HEALTH INSURANCE | Admitting: Family Medicine

## 2015-06-07 VITALS — BP 128/80 | HR 76 | Ht 73.0 in | Wt 220.0 lb

## 2015-06-07 DIAGNOSIS — M25561 Pain in right knee: Secondary | ICD-10-CM | POA: Diagnosis not present

## 2015-06-07 DIAGNOSIS — S83249A Other tear of medial meniscus, current injury, unspecified knee, initial encounter: Secondary | ICD-10-CM | POA: Insufficient documentation

## 2015-06-07 DIAGNOSIS — S83241A Other tear of medial meniscus, current injury, right knee, initial encounter: Secondary | ICD-10-CM | POA: Diagnosis not present

## 2015-06-07 MED ORDER — MELOXICAM 15 MG PO TABS: 15.0000 mg | ORAL_TABLET | Freq: Every day | ORAL | Status: DC

## 2015-06-07 NOTE — Assessment & Plan Note (Signed)
Patient does have a tear. Patient elected try conservative therapy. We sent in an anti-inflammatory, home exercises was given to him and went over with the athletic trainer. We discussed icing regimen. Patient will wait significant twisting motions. Patient will come back again in 3 weeks. Continued have pain we'll consider injection versus possibly advance imaging.

## 2015-06-07 NOTE — Progress Notes (Signed)
Pre visit review using our clinic review tool, if applicable. No additional management support is needed unless otherwise documented below in the visit note. 

## 2015-06-07 NOTE — Patient Instructions (Addendum)
Congrats!!!! Ice 20 minutes 2 times daily. Usually after activity and before bed. Exercises 3 times a week.  Penssaid is good Meloxicam daily for 10 days and then as needed See me again in 2 weeks if not better we will do injection.

## 2015-06-07 NOTE — Progress Notes (Signed)
  [facility] [employer] [place], [place] [address] Phone: [phone] Subjective:    I'm seeing this patient by the request  of:  Arnette Norris, MD   CC: Right knee pain  [id number] Joseph Church is a 54 y.o. male coming in with complaint of right knee pain. Patient states 2 weeks ago started having pain. States that it seems to be on the anterior medial aspect of the knee. Worse when he does more flexion such as going upstairs or going from a seated to standing position. States that it can be severe pain about 9 out of 10. Feels somewhat unstable when he does that. Has never locked on him. States that even at night it can give him some mild discomfort. Has responded fairly well to oral anti-inflammatory studies taken occasionally. Has been icing somewhat. Has not remember any specific injury but states playing golf recently cause some pain.     Past medical history, social, surgical and family history all reviewed in electronic medical record.   Review of Systems: No headache, visual changes, nausea, vomiting, diarrhea, constipation, dizziness, abdominal pain, skin rash, fevers, chills, night sweats, weight loss, swollen lymph nodes, body aches, joint swelling, muscle aches, chest pain, shortness of breath, mood changes.   Objective Blood pressure 128/80, pulse 76, height 6\' 1"  (1.854 m), weight 220 lb (99.791 kg).  General: No apparent distress alert and oriented x3 mood and affect normal, dressed appropriately.  HEENT: Pupils equal, extraocular movements intact  Respiratory: Patient's speak in full sentences and does not appear short of breath  Cardiovascular: No lower extremity edema, non tender, no erythema  Skin: Warm dry intact with no signs of infection or rash on extremities or on axial skeleton.  Abdomen: Soft nontender  Neuro: Cranial nerves II through XII are intact, neurovascularly intact in all extremities with 2+ DTRs and 2+ pulses.   Lymph: No lymphadenopathy of posterior or anterior cervical chain or axillae bilaterally.  Gait normal with good balance and coordination.  MSK:  Non tender with full range of motion and good stability and symmetric strength and tone of shoulders, elbows, wrist, hip, and ankles bilaterally.  Knee: Right Normal to inspection with no erythema or effusion or obvious bony abnormalities. Tender over the medial anterior joint line. ROM full in flexion and extension and lower leg rotation. Ligaments with solid consistent endpoints including ACL, PCL, LCL, MCL. The minor instability with valgus stress Positive Mcmurray's, Apley's, and Thessalonian tests. Non painful patellar compression. Patellar glide without crepitus. Patellar and quadriceps tendons unremarkable. Hamstring and quadriceps strength is normal. Contralateral knee unremarkable  MSK US performed of: Right This study was ordered, performed, and interpreted by Charlann Boxer D.O.  Knee: All structures visualized. Anterior medial meniscus does have a tear noted with increasing Doppler flow and hypoechoic changes. Patient also has deep calcific changes that seem to be possibly loose body.. Patellar Tendon unremarkable on long and transverse views without effusion. No abnormality of prepatellar bursa. LCL and MCL unremarkable on long and transverse views. No abnormality of origin of medial or lateral head of the gastrocnemius.  IMPRESSION:  Acute medial meniscal injury with calcific change    Impression and Recommendations:     This case required medical decision making of moderate complexity.   '

## 2015-06-14 ENCOUNTER — Other Ambulatory Visit: Payer: Self-pay | Admitting: Family Medicine

## 2015-06-14 NOTE — Telephone Encounter (Signed)
Last f/u 06/2014 

## 2015-06-14 NOTE — Telephone Encounter (Signed)
Rx called in to requested pharmacy 

## 2015-06-26 ENCOUNTER — Encounter: Payer: Self-pay | Admitting: Family Medicine

## 2015-06-26 ENCOUNTER — Ambulatory Visit: Payer: PRIVATE HEALTH INSURANCE | Admitting: Family Medicine

## 2015-06-26 ENCOUNTER — Ambulatory Visit (INDEPENDENT_AMBULATORY_CARE_PROVIDER_SITE_OTHER): Payer: PRIVATE HEALTH INSURANCE | Admitting: Family Medicine

## 2015-06-26 VITALS — BP 112/80 | HR 84 | Ht 73.0 in | Wt 224.0 lb

## 2015-06-26 DIAGNOSIS — M67912 Unspecified disorder of synovium and tendon, left shoulder: Secondary | ICD-10-CM | POA: Diagnosis not present

## 2015-06-26 DIAGNOSIS — S83241D Other tear of medial meniscus, current injury, right knee, subsequent encounter: Secondary | ICD-10-CM

## 2015-06-26 MED ORDER — MELOXICAM 15 MG PO TABS: 15.0000 mg | ORAL_TABLET | Freq: Every day | ORAL | Status: DC

## 2015-06-26 NOTE — Progress Notes (Signed)
  Joseph Church Joseph Church, [place] [address] Phone: [phone] Subjective:    I'm seeing this patient by the request  of:  Joseph Church, Joseph Church   CC: Right knee pain  Joseph Church is a 54 y.o. male coming in with complaint of right knee pain. Patient was seen previously and was found to have a meniscal injury with some calcific changes. Patient elected try conservative therapy. Patient was to do home exercises, icing, and avoiding certain activities. Patient states she is very happy with the knee. States that he is 100% better. Not having any clicking or locking or giving out on him. Continues on the meloxicam daily.  Patient also states that his left shoulder seems to be doing relatively well. Some mild discomfort from time to time but nothing that causes him to stop activity. Patient is playing golf on a regular basis. Patient will continue to moderate. Doing the exercises intermittently.     Past medical history, social, surgical and family history all reviewed in electronic medical record.   Review of Systems: No headache, visual changes, nausea, vomiting, diarrhea, constipation, dizziness, abdominal pain, skin rash, fevers, chills, night sweats, weight loss, swollen lymph nodes, body aches, joint swelling, muscle aches, chest pain, shortness of breath, mood changes.   Objective Blood pressure 112/80, pulse 84, height 6\' 1"  (1.854 m), weight 224 lb (101.606 kg), SpO2 95 %.  General: No apparent distress alert and oriented x3 mood and affect normal, dressed appropriately.  HEENT: Pupils equal, extraocular movements intact  Respiratory: Patient's speak in full sentences and does not appear short of breath  Cardiovascular: No lower extremity edema, non tender, no erythema  Skin: Warm dry intact with no signs of infection or rash on extremities or on axial skeleton.  Abdomen: Soft nontender  Neuro: Cranial nerves II through XII are  intact, neurovascularly intact in all extremities with 2+ DTRs and 2+ pulses.  Lymph: No lymphadenopathy of posterior or anterior cervical chain or axillae bilaterally.  Gait normal with good balance and coordination.  MSK:  Non tender with full range of motion and good stability and symmetric strength and tone of , elbows, wrist, hip, and ankles bilaterally.  Knee: Right Normal to inspection with no erythema or effusion or obvious bony abnormalities. Tender over the medial anterior joint line. ROM full in flexion and extension and lower leg rotation. Ligaments with solid consistent endpoints including ACL, PCL, LCL, MCL. The minor instability with valgus stress Mild positive Mcmurray's, Apley's, and Thessalonian tests. This is improved Non painful patellar compression. Patellar glide without crepitus. Patellar and quadriceps tendons unremarkable. Hamstring and quadriceps strength is normal.  Contralateral knee unremarkable  Shoulder: Left Inspection reveals no abnormalities, atrophy or asymmetry. Palpation is normal with no tenderness over AC joint or bicipital groove. ROM is full in all planes. Rotator cuff strength normal throughout. Mild impingement sign still remaining Speeds and Yergason's tests normal. No labral pathology noted with negative Obrien's, negative clunk and good stability. Normal scapular function observed. No painful arc and no drop arm sign. No apprehension sign       Impression and Recommendations:     This case required medical decision making of moderate complexity.   '

## 2015-06-26 NOTE — Patient Instructions (Signed)
Good to see you Ice still at night Stay active Can repeat injection in shoulder in 6 weeks if needed Otherwise see me when you need me.

## 2015-06-26 NOTE — Assessment & Plan Note (Signed)
Patient overall seems to be doing very well. Still some mild positive meniscal signs but patient states that daily activities seem to be normal. We discussed continuing the anti-inflammatory as needed. Patient will continue with the exercises and see me as needed.

## 2015-06-26 NOTE — Assessment & Plan Note (Signed)
Patient is 6 weeks from the injection still doing fairly well. Mild impingement still remaining. We will continue to monitor and we discussed with patient that we can repeat injection in 3 months from the initial injection. We also discussed the possibility of further imaging or formal physical therapy which patient declined.

## 2015-06-26 NOTE — Progress Notes (Signed)
Pre visit review using our clinic review tool, if applicable. No additional management support is needed unless otherwise documented below in the visit note. 

## 2015-07-04 ENCOUNTER — Emergency Department
Admission: EM | Admit: 2015-07-04 | Discharge: 2015-07-04 | Disposition: A | Discharge status: Home / Self Care | Payer: PRIVATE HEALTH INSURANCE | Attending: Emergency Medicine | Admitting: Emergency Medicine

## 2015-07-04 ENCOUNTER — Encounter: Payer: Self-pay | Admitting: Emergency Medicine

## 2015-07-04 ENCOUNTER — Emergency Department: Payer: PRIVATE HEALTH INSURANCE

## 2015-07-04 DIAGNOSIS — R079 Chest pain, unspecified: Secondary | ICD-10-CM | POA: Diagnosis not present

## 2015-07-04 DIAGNOSIS — Z7982 Long term (current) use of aspirin: Secondary | ICD-10-CM | POA: Diagnosis not present

## 2015-07-04 DIAGNOSIS — Z791 Long term (current) use of non-steroidal anti-inflammatories (NSAID): Secondary | ICD-10-CM | POA: Insufficient documentation

## 2015-07-04 DIAGNOSIS — E860 Dehydration: Secondary | ICD-10-CM | POA: Insufficient documentation

## 2015-07-04 DIAGNOSIS — I10 Essential (primary) hypertension: Secondary | ICD-10-CM | POA: Diagnosis not present

## 2015-07-04 DIAGNOSIS — Z79899 Other long term (current) drug therapy: Secondary | ICD-10-CM | POA: Diagnosis not present

## 2015-07-04 DIAGNOSIS — R0789 Other chest pain: Secondary | ICD-10-CM

## 2015-07-04 DIAGNOSIS — Z9889 Other specified postprocedural states: Secondary | ICD-10-CM | POA: Diagnosis not present

## 2015-07-04 LAB — CBC
HEMATOCRIT: 43.1 % (ref 40.0–52.0)
HEMOGLOBIN: 14.9 g/dL (ref 13.0–18.0)
MCH: 30.5 pg (ref 26.0–34.0)
MCHC: 34.5 g/dL (ref 32.0–36.0)
MCV: 88.3 fL (ref 80.0–100.0)
Platelets: 215 10*3/uL (ref 150–440)
RBC: 4.87 MIL/uL (ref 4.40–5.90)
RDW: 15.4 % — AB (ref 11.5–14.5)
WBC: 8.8 10*3/uL (ref 3.8–10.6)

## 2015-07-04 LAB — COMPREHENSIVE METABOLIC PANEL
ALBUMIN: 4.2 g/dL (ref 3.5–5.0)
ALK PHOS: 58 U/L (ref 38–126)
ALT: 27 U/L (ref 17–63)
AST: 28 U/L (ref 15–41)
Anion gap: 7 (ref 5–15)
BILIRUBIN TOTAL: 0.6 mg/dL (ref 0.3–1.2)
BUN: 22 mg/dL — AB (ref 6–20)
CALCIUM: 9.3 mg/dL (ref 8.9–10.3)
CO2: 27 mmol/L (ref 22–32)
CREATININE: 1.15 mg/dL (ref 0.61–1.24)
Chloride: 108 mmol/L (ref 101–111)
GFR calc Af Amer: >60 mL/min (ref >60)
GFR calc non Af Amer: >60 mL/min (ref >60)
GLUCOSE: 122 mg/dL — AB (ref 65–99)
Potassium: 4.1 mmol/L (ref 3.5–5.1)
SODIUM: 142 mmol/L (ref 135–145)
TOTAL PROTEIN: 7 g/dL (ref 6.5–8.1)

## 2015-07-04 LAB — TROPONIN I: Troponin I: <0.03 ng/mL (ref <0.031)

## 2015-07-04 MED ORDER — SODIUM CHLORIDE 0.9 % IV BOLUS (SEPSIS)
1000.0000 mL | Freq: Once | INTRAVENOUS | Status: AC
  Administered 2015-07-04: 1000 mL via INTRAVENOUS

## 2015-07-04 MED ORDER — ASPIRIN 81 MG PO CHEW
324.0000 mg | CHEWABLE_TABLET | Freq: Once | ORAL | Status: AC
  Administered 2015-07-04: 324 mg via ORAL
  Filled 2015-07-04: qty 4

## 2015-07-04 NOTE — ED Provider Notes (Signed)
[facility] Emergency Department Provider Note  ____________________________________________  Time seen: Approximately 4:56 PM  I have reviewed the triage vital signs and the nursing notes.   HISTORY  Chief Complaint Chest Pain Chest Discomfort  HPI Joseph Church is a 54 y.o. male who presents to the emergency department for chest discomfort today. He states that he has had a heaviness/pressure on the left chest wall throughout the day. He denies diaphoresis, nausea or radiation of pain. He reports having had a heart cath around 2001 that showed 25% blockage and "4 out of 5 arteries". He had a negative stress test May 2015. He denies recent upper respiratory illness. He states that he had the pain this morning, but went to play golf. The pain did not increase or change with exertion.   Past Medical History  Diagnosis Date  . GERD (gastroesophageal reflux disease)   . Hyperlipidemia   . Hypertension     Patient Active Problem List   Diagnosis Date Noted  . Acute medial meniscal tear 06/07/2015  . Benign colon polyp 05/13/2015  . Tendinopathy of left rotator cuff 05/13/2015  . Poison ivy dermatitis 02/21/2015  . Visit for well man health check 07/10/2014  . Cervical spine pain 07/10/2014  . Neck pain 06/07/2014  . Insomnia 07/07/2013  . Erectile dysfunction 07/07/2013  . GERD (gastroesophageal reflux disease)   . Hyperlipidemia   . Hypertension     Past Surgical History  Procedure Laterality Date  . Cholecystectomy, laparoscopic  2000  . Tonsillectomy and adenoidectomy  1960's  . Vasectomy  1987  . Vasectomy reversal  1994  . Keratotomy  1995  . Cardiac catheterization      [facility]    Current Outpatient Rx  Name  Route  Sig  Dispense  Refill  . aspirin 81 MG tablet   Oral   Take 81 mg by mouth daily.         . Diclofenac Sodium 2 % SOLN      Apply 1 pump twice daily.   112 g   3      [phone] (M)   . FLUoxetine (PROZAC) 20 MG tablet   Oral   Take 1 tablet (20 mg total) by mouth daily.   90 tablet   0     Office visit required for additional refills   . ibuprofen (ADVIL,MOTRIN) 800 MG tablet   Oral   Take 1 tablet (800 mg total) by mouth every 8 (eight) hours as needed.   90 tablet   0   . meloxicam (MOBIC) 15 MG tablet   Oral   Take 1 tablet (15 mg total) by mouth daily.   90 tablet   1   . metaxalone (SKELAXIN) 800 MG tablet   Oral   Take 1 tablet (800 mg total) by mouth 3 (three) times daily.   90 tablet   0   . olmesartan (BENICAR) 20 MG tablet   Oral   Take 1 tablet (20 mg total) by mouth daily.   90 tablet   3   . omeprazole (PRILOSEC) 20 MG capsule   Oral   Take 1 capsule (20 mg total) by mouth daily as needed.   90 capsule   3   . predniSONE (DELTASONE) 10 MG tablet      Take 60mg  orally day one, then 55 mg orally day two, then 50 mg orally day three, then taper by 5 mg daily over 12  days until complete.   35 tablet   0   . ranitidine (ZANTAC) 150 MG capsule      Take 1 capsule (150 mg total) by mouth 2 (two) times daily as needed for heartburn.   90 capsule   3   . rosuvastatin (CRESTOR) 40 MG tablet   Oral   Take 1 tablet (40 mg total) by mouth at bedtime.   90 tablet   1   . tadalafil (CIALIS) 5 MG tablet   Oral   Take 1 tablet (5 mg total) by mouth daily.   90 tablet   1   . zolpidem (AMBIEN) 10 MG tablet      TAKE 1 TABLET BY MOUTH AT BEDTIME   30 tablet   0     Not to exceed 5 additional fills before 11/05/2015 ...     Allergies Lisinopril  Family History  Problem Relation Age of Onset  . Cancer Mother   . Cancer Father     lymphoma  . Hypertension Father   . Hyperlipidemia Father     Social History Social History  Substance Use Topics  . Smoking status: Never Smoker   . Smokeless tobacco: None  . Alcohol Use: 5.0 oz/week    10 drink(s) per week     Comment: daily    Review of  Systems Constitutional: No fever/chills. No diaphoresis with the exception of normal perspiration while playing golf today. Eyes: No visual changes. ENT: No sore throat. Cardiovascular: Positive for chest pain. Respiratory: Denies shortness of breath. Gastrointestinal: No abdominal pain.  No nausea, no vomiting.  No diarrhea.  No constipation. Genitourinary: Negative for dysuria. Musculoskeletal: Negative for back pain. Skin: Negative for rash. Neurological: Negative for headaches, focal weakness or numbness.  10-point ROS otherwise negative.  ____________________________________________   PHYSICAL EXAM:  VITAL SIGNS: 138/90; HR: 87; SpO2: 97%; Resp: 16 ED Triage Vitals  Enc Vitals Group     BP --      Pulse --      Resp --      Temp --      Temp src --      SpO2 --      Weight --      Height --      Head Cir --      Peak Flow --      Pain Score --      Pain Loc --      Pain Edu? --      Excl. in [place]? --     Constitutional: Alert and oriented. Well appearing and in no acute distress. Eyes: Conjunctivae are normal. PERRL. EOMI. Head: Atraumatic. Nose: No congestion/rhinnorhea. Mouth/Throat: Mucous membranes are moist.  Oropharynx non-erythematous. Neck: No stridor.   Cardiovascular: Normal rate, regular rhythm. Grossly normal heart sounds.  Good peripheral circulation. Respiratory: Normal respiratory effort.  No retractions. Lungs CTAB. Gastrointestinal: Soft and nontender. No distention. No abdominal bruits. No CVA tenderness. Musculoskeletal: No lower extremity tenderness nor edema.  No joint effusions. Neurologic:  Normal speech and language. No gross focal neurologic deficits are appreciated. No gait instability. Skin:  Skin is warm, dry and intact. No rash noted. Psychiatric: Mood and affect are normal. Speech and behavior are normal.  ____________________________________________   LABS (all labs ordered are listed, but only abnormal results are  displayed)  Labs Reviewed  CBC - Abnormal; Notable for the following:    RDW 15.4 (*)    All other components within normal limits  COMPREHENSIVE METABOLIC PANEL - Abnormal; Notable for the following:    Glucose, Bld 122 (*)    BUN 22 (*)    All other components within normal limits  TROPONIN I   ____________________________________________  EKG  Normal sinus rhythm with a rate of 91 bpm, normal QT segment normal QRS, normal PR, normal axis ____________________________________________  RADIOLOGY  Chest x-ray negative for acute cardiopulmonary abnormality.  I, Sherrie George, personally viewed and evaluated these images (plain radiographs) as part of my medical decision making.   ____________________________________________   PROCEDURES  Procedure(s) performed: None  Critical Care performed: No  ____________________________________________   INITIAL IMPRESSION / ASSESSMENT AND PLAN / ED COURSE  Pertinent labs & imaging results that were available during my care of the patient were reviewed by me and considered in my medical decision making (see chart for details).  Patient was given 1 L of normal saline via IV. Pain started approximately 5 hours ago. Troponin is negative. He was advised to follow-up with his cardiologist. He was advised to return to the emergency department immediately for symptoms that change or worsen or for new concerns. ____________________________________________   FINAL CLINICAL IMPRESSION(S) / ED DIAGNOSES  Final diagnoses:  Chest pain of uncertain etiology  Dehydration      Victorino Dike, FNP 07/04/15 1909  Carrie Mew, MD 07/04/15 2126

## 2015-07-04 NOTE — Discharge Instructions (Signed)

## 2015-07-04 NOTE — ED Notes (Signed)
Pt comes into the ED c/o chest pain on the left side of the chest.  Described as pressure that has been going on for a couple of hours.  C/o nausea and lightheadedness.  History of blockage before.

## 2015-07-04 NOTE — ED Notes (Signed)
Pt will be d/c once fluids finish. Pt made aware and verbalized understanding.

## 2015-07-06 ENCOUNTER — Other Ambulatory Visit: Payer: Self-pay | Admitting: Family Medicine

## 2015-07-06 DIAGNOSIS — Z0000 Encounter for general adult medical examination without abnormal findings: Secondary | ICD-10-CM

## 2015-07-06 DIAGNOSIS — E785 Hyperlipidemia, unspecified: Secondary | ICD-10-CM

## 2015-07-06 DIAGNOSIS — Z125 Encounter for screening for malignant neoplasm of prostate: Secondary | ICD-10-CM

## 2015-07-06 DIAGNOSIS — I10 Essential (primary) hypertension: Secondary | ICD-10-CM

## 2015-07-11 ENCOUNTER — Other Ambulatory Visit (INDEPENDENT_AMBULATORY_CARE_PROVIDER_SITE_OTHER): Payer: PRIVATE HEALTH INSURANCE

## 2015-07-11 DIAGNOSIS — Z0000 Encounter for general adult medical examination without abnormal findings: Secondary | ICD-10-CM

## 2015-07-11 DIAGNOSIS — E785 Hyperlipidemia, unspecified: Secondary | ICD-10-CM | POA: Diagnosis not present

## 2015-07-11 DIAGNOSIS — Z125 Encounter for screening for malignant neoplasm of prostate: Secondary | ICD-10-CM | POA: Diagnosis not present

## 2015-07-11 LAB — CBC WITH DIFFERENTIAL/PLATELET
BASOS PCT: 0.7 % (ref 0.0–3.0)
Basophils Absolute: 0.1 10*3/uL (ref 0.0–0.1)
EOS ABS: 0.3 10*3/uL (ref 0.0–0.7)
Eosinophils Relative: 4.2 % (ref 0.0–5.0)
HCT: 46.3 % (ref 39.0–52.0)
HEMOGLOBIN: 15.7 g/dL (ref 13.0–17.0)
LYMPHS ABS: 2.3 10*3/uL (ref 0.7–4.0)
Lymphocytes Relative: 30.7 % (ref 12.0–46.0)
MCHC: 34 g/dL (ref 30.0–36.0)
MCV: 89.4 fl (ref 78.0–100.0)
MONO ABS: 0.7 10*3/uL (ref 0.1–1.0)
Monocytes Relative: 9.3 % (ref 3.0–12.0)
NEUTROS PCT: 55.1 % (ref 43.0–77.0)
Neutro Abs: 4.2 10*3/uL (ref 1.4–7.7)
PLATELETS: 231 10*3/uL (ref 150.0–400.0)
RBC: 5.18 Mil/uL (ref 4.22–5.81)
RDW: 14.7 % (ref 11.5–15.5)
WBC: 7.6 10*3/uL (ref 4.0–10.5)

## 2015-07-11 LAB — COMPREHENSIVE METABOLIC PANEL
ALT: 22 U/L (ref 0–53)
AST: 22 U/L (ref 0–37)
Albumin: 4 g/dL (ref 3.5–5.2)
Alkaline Phosphatase: 52 U/L (ref 39–117)
BUN: 21 mg/dL (ref 6–23)
CHLORIDE: 103 meq/L (ref 96–112)
CO2: 31 meq/L (ref 19–32)
CREATININE: 1 mg/dL (ref 0.40–1.50)
Calcium: 9.4 mg/dL (ref 8.4–10.5)
GFR: 82.46 mL/min (ref >60.00)
GLUCOSE: 98 mg/dL (ref 70–99)
Potassium: 4.5 mEq/L (ref 3.5–5.1)
SODIUM: 141 meq/L (ref 135–145)
Total Bilirubin: 0.8 mg/dL (ref 0.2–1.2)
Total Protein: 6.3 g/dL (ref 6.0–8.3)

## 2015-07-11 LAB — LIPID PANEL
CHOL/HDL RATIO: 3
Cholesterol: 145 mg/dL (ref 0–200)
HDL: 45.1 mg/dL (ref >39.00)
LDL CALC: 64 mg/dL (ref 0–99)
NonHDL: 100.1
Triglycerides: 180 mg/dL — ABNORMAL HIGH (ref 0.0–149.0)
VLDL: 36 mg/dL (ref 0.0–40.0)

## 2015-07-11 LAB — PSA: PSA: 0.69 ng/mL (ref 0.10–4.00)

## 2015-07-11 LAB — TSH: TSH: 2.98 u[IU]/mL (ref 0.35–4.50)

## 2015-07-11 NOTE — Addendum Note (Signed)
Addended by: Ellamae Sia on: 07/11/2015 09:42 AM   Modules accepted: Orders

## 2015-07-12 ENCOUNTER — Other Ambulatory Visit: Payer: PRIVATE HEALTH INSURANCE

## 2015-07-17 ENCOUNTER — Encounter: Payer: Self-pay | Admitting: Family Medicine

## 2015-07-17 ENCOUNTER — Ambulatory Visit (INDEPENDENT_AMBULATORY_CARE_PROVIDER_SITE_OTHER): Payer: PRIVATE HEALTH INSURANCE | Admitting: Family Medicine

## 2015-07-17 VITALS — BP 126/74 | HR 74 | Temp 98.2°F | Wt 227.0 lb

## 2015-07-17 DIAGNOSIS — R079 Chest pain, unspecified: Secondary | ICD-10-CM | POA: Insufficient documentation

## 2015-07-17 DIAGNOSIS — K219 Gastro-esophageal reflux disease without esophagitis: Secondary | ICD-10-CM

## 2015-07-17 DIAGNOSIS — I10 Essential (primary) hypertension: Secondary | ICD-10-CM

## 2015-07-17 DIAGNOSIS — Z0000 Encounter for general adult medical examination without abnormal findings: Secondary | ICD-10-CM | POA: Diagnosis not present

## 2015-07-17 DIAGNOSIS — E785 Hyperlipidemia, unspecified: Secondary | ICD-10-CM | POA: Diagnosis not present

## 2015-07-17 DIAGNOSIS — G4700 Insomnia, unspecified: Secondary | ICD-10-CM | POA: Diagnosis not present

## 2015-07-17 DIAGNOSIS — R0683 Snoring: Secondary | ICD-10-CM | POA: Diagnosis not present

## 2015-07-17 NOTE — Assessment & Plan Note (Signed)
Persistent with strong FH of premature CAD. Refer back to Dr. Fletcher Anon- ? Cath. The patient indicates understanding of these issues and agrees with the plan.

## 2015-07-17 NOTE — Assessment & Plan Note (Signed)
Well controlled on current dose of zocor. No changes made today. 

## 2015-07-17 NOTE — Assessment & Plan Note (Signed)
Refer to pulm for sleep study to rule out OSA.

## 2015-07-17 NOTE — Assessment & Plan Note (Signed)
Normotensive. No changes made to rxs today. 

## 2015-07-17 NOTE — Assessment & Plan Note (Signed)
Reviewed preventive care protocols, scheduled due services, and updated immunizations Discussed nutrition, exercise, diet, and healthy lifestyle.  

## 2015-07-17 NOTE — Progress Notes (Signed)
Pre visit review using our clinic review tool, if applicable. No additional management support is needed unless otherwise documented below in the visit note. 

## 2015-07-17 NOTE — Assessment & Plan Note (Signed)
Stable on prn ambien. No changes made to rxs today.

## 2015-07-17 NOTE — Progress Notes (Signed)
Subjective:    Patient ID: Joseph Church, male    DOB: 10-Feb-1961, 54 y.o.   MRN: [id number]  HPI  Very pleasant 8 male here  For CPX and follow up chronic medical conditions.   UTD colonoscopy- 2012- cleared for 10 years.  Doing well- working quite a bit.    Newlywed- happy in his marriage.  Wife says he does snore and she is concerned at times it sounds like he stops breathing.  Neck pain improved s/p two injections with Dr. Maryjean Ka.  Was seen in ER for CP while playing golf on 10/13- note reviewed. Troponin, EKG neg.  Given Liter of Fluids. Still having intermittent left sided chest pain- atypical, not relieved by rest. Has had some associated nausea.  Strong FH of CAD. He did have a cath 16 year ago and neg stress test two years ago (Dr. Fletcher Anon).   HTN- on Benicar 20 mg daily.  BP has been well controlled on this. No CP, SOB or LE edema.  BP was not controlled on amlodipine and metoprolol worsened ED.  Lab Results  Component Value Date   CREATININE 1.00 07/11/2015   HLD- on Crestor 40 mg qhs.    Denies myalgias. Lab Results  Component Value Date   CHOL 145 07/11/2015   HDL 45.10 07/11/2015   LDLCALC 64 07/11/2015   TRIG 180.0* 07/11/2015   CHOLHDL 3 07/11/2015    Lab Results  Component Value Date   PSA 0.69 07/11/2015   PSA 0.61 07/03/2014   PSA 0.57 07/07/2013   Insomnia- takes Ambien approx 20 out of 30 days.  Does not take on weekends or nights that he does not have to work.  Could not tolerate antihistamines or lunesta.  GERD- on Zantac and Omeprazole.  Non smoker. Does drink energy drinks.    ED- takes daily cialis without side effects.  He is pleased with results.        Patient Active Problem List   Diagnosis Date Noted  . Benign colon polyp 05/13/2015  . Visit for well man health check 07/10/2014  . Cervical spine pain 07/10/2014  . Insomnia 07/07/2013  . Erectile dysfunction 07/07/2013  . GERD (gastroesophageal reflux disease)   .  Hyperlipidemia   . Hypertension    Past Medical History  Diagnosis Date  . GERD (gastroesophageal reflux disease)   . Hyperlipidemia   . Hypertension    Past Surgical History  Procedure Laterality Date  . Cholecystectomy, laparoscopic  2000  . Tonsillectomy and adenoidectomy  1960's  . Vasectomy  1987  . Vasectomy reversal  1994  . Keratotomy  1995  . Cardiac catheterization      [facility]   Social History  Substance Use Topics  . Smoking status: Never Smoker   . Smokeless tobacco: None  . Alcohol Use: 5.0 oz/week    10 drink(s) per week     Comment: daily   Family History  Problem Relation Age of Onset  . Cancer Mother   . Cancer Father     lymphoma  . Hypertension Father   . Hyperlipidemia Father    Allergies  Allergen Reactions  . Lisinopril Cough   Current Outpatient Prescriptions on File Prior to Visit  Medication Sig Dispense Refill  . aspirin 81 MG tablet Take 81 mg by mouth daily.    . Diclofenac Sodium 2 % SOLN Apply 1 pump twice daily. 112 g 3  . FLUoxetine (PROZAC) 20 MG tablet Take 1 tablet (  20 mg total) by mouth daily. 90 tablet 0  . ibuprofen (ADVIL,MOTRIN) 800 MG tablet Take 1 tablet (800 mg total) by mouth every 8 (eight) hours as needed. 90 tablet 0  . meloxicam (MOBIC) 15 MG tablet Take 1 tablet (15 mg total) by mouth daily. 90 tablet 1  . metaxalone (SKELAXIN) 800 MG tablet Take 1 tablet (800 mg total) by mouth 3 (three) times daily. 90 tablet 0  . olmesartan (BENICAR) 20 MG tablet Take 1 tablet (20 mg total) by mouth daily. 90 tablet 3  . omeprazole (PRILOSEC) 20 MG capsule Take 1 capsule (20 mg total) by mouth daily as needed. 90 capsule 3  . ranitidine (ZANTAC) 150 MG capsule Take 1 capsule (150 mg total) by mouth 2 (two) times daily as needed for heartburn. 90 capsule 3  . rosuvastatin (CRESTOR) 40 MG tablet Take 1 tablet (40 mg total) by mouth at bedtime. 90 tablet 1  . tadalafil (CIALIS) 5 MG tablet Take  1 tablet (5 mg total) by mouth daily. 90 tablet 1  . zolpidem (AMBIEN) 10 MG tablet TAKE 1 TABLET BY MOUTH AT BEDTIME 30 tablet 0   No current facility-administered medications on file prior to visit.   The PMH, PSH, Social History, Family History, Medications, and allergies have been reviewed in [facility], and have been updated if relevant.  Review of Systems  Constitutional: Negative.   HENT: Negative.   Eyes: Negative.   Respiratory: Negative.   Cardiovascular: Positive for chest pain. Negative for palpitations.  Gastrointestinal: Negative.   Endocrine: Negative.   Genitourinary: Negative.  Negative for dysuria, urgency, frequency and difficulty urinating.  Musculoskeletal: Negative for neck pain.  Allergic/Immunologic: Negative.   Neurological: Negative.   Hematological: Negative.   Psychiatric/Behavioral: Negative.   All other systems reviewed and are negative.     Objective:   Physical Exam BP 126/74 mmHg  Pulse 74  Temp(Src) 98.2 F (36.8 C) (Oral)  Wt 227 lb (102.967 kg)  SpO2 97% General:  pleasant male in NAD Eyes:  PERRL Ears:  External ear exam shows no significant lesions or deformities.  Otoscopic examination reveals clear canals, tympanic membranes are intact bilaterally without bulging, retraction, inflammation or discharge. Hearing is grossly normal bilaterally. Nose:  External nasal examination shows no deformity or inflammation. Nasal mucosa are pink and moist without lesions or exudates. Mouth:  Oral mucosa and oropharynx without lesions or exudates.  Teeth in good repair. Neck:  no carotid bruit or thyromegaly no cervical or supraclavicular lymphadenopathy  Lungs:  Normal respiratory effort, chest expands symmetrically. Lungs are clear to auscultation, no crackles or wheezes. Heart:  Normal rate and regular rhythm. S1 and S2 normal without gallop, murmur, click, rub or other extra sounds. Abdomen:  Bowel sounds positive,abdomen soft and non-tender without  masses, organomegaly or hernias noted. Pulses:  R and L posterior tibial pulses are full and equal bilaterally  Extremities:  no edema    Assessment & Plan:

## 2015-07-17 NOTE — Patient Instructions (Signed)
Great to see you. Please stop by to see Joseph Church on your way out.   

## 2015-07-22 ENCOUNTER — Other Ambulatory Visit: Payer: Self-pay | Admitting: Family Medicine

## 2015-07-22 NOTE — Telephone Encounter (Signed)
Patient called and said the prescription for his Zolpidem was suppose to be called in last week when he was seen at his office visit.  Patient would like prescription sent to [facility].  Please call patient when prescription is called in to pharmacy.

## 2015-07-27 ENCOUNTER — Other Ambulatory Visit: Payer: Self-pay | Admitting: Family Medicine

## 2015-07-29 ENCOUNTER — Encounter: Payer: Self-pay | Admitting: *Deleted

## 2015-07-29 NOTE — Telephone Encounter (Signed)
Pt came into office and states medication was never called in to pharmacy. When approved, Dr Deborra Medina sent note back to Anna at the front desk and medication was never called in. Rx called in to requested pharmacy

## 2015-08-09 ENCOUNTER — Ambulatory Visit: Payer: PRIVATE HEALTH INSURANCE | Admitting: Physician Assistant

## 2015-08-14 ENCOUNTER — Ambulatory Visit (INDEPENDENT_AMBULATORY_CARE_PROVIDER_SITE_OTHER): Payer: PRIVATE HEALTH INSURANCE | Admitting: Internal Medicine

## 2015-08-14 ENCOUNTER — Encounter: Payer: Self-pay | Admitting: Internal Medicine

## 2015-08-14 VITALS — BP 122/62 | HR 78 | Ht 73.0 in | Wt 229.0 lb

## 2015-08-14 DIAGNOSIS — G4719 Other hypersomnia: Secondary | ICD-10-CM | POA: Diagnosis not present

## 2015-08-14 NOTE — Patient Instructions (Addendum)
--  Home sleep study.    Sleep Apnea Sleep apnea is disorder that affects a person's sleep. A person with sleep apnea has abnormal pauses in their breathing when they sleep. It is hard for them to get a good sleep. This makes a person tired during the day. It also can lead to other physical problems. There are three types of sleep apnea. One type is when breathing stops for a short time because your airway is blocked (obstructive sleep apnea). Another type is when the brain sometimes fails to give the normal signal to breathe to the muscles that control your breathing (central sleep apnea). The third type is a combination of the other two types. HOME CARE   Take all medicine as told by your doctor.  Avoid alcohol, calming medicines (sedatives), and depressant drugs.  Try to lose weight if you are overweight. Talk to your doctor about a healthy weight goal.  Your doctor may have you use a device that helps to open your airway. It can help you get the air that you need. It is called a positive airway pressure (PAP) device.   MAKE SURE YOU:   Understand these instructions.  Will watch your condition.  Will get help right away if you are not doing well or get worse.  It may take approximately 1 month for you to get used to wearing her CPAP every night.  Be sure to work with

## 2015-08-14 NOTE — Progress Notes (Signed)
[facility] Consultation      Assessment and Plan:  Excessive daytime sleepiness. -Symptoms and signs of obstructive sleep apnea. -Patient is also on fluoxetine, which could be contributory.  Snoring -Snoring, with witnessed apneas.  Insomnia.  -Currently controlled with Ambien.  Anxiety/Depression.  -Currently on fluoxetine.  Date: 08/14/2015  MRN# [id number] LEANDROS HELSLEY Sep 07, 1961  Referring Physician: Dr. Deborra Medina.   Joseph Church is a 54 y.o. old male seen in consultation for chief complaint of:    Chief Complaint  Patient presents with  . SLEEP CONSULT    pt. ref. by dr Deborra Medina. pt. states wife says he stops breathing while sleeping and loud snoring.    HPI:   The patient is a 54 year old male who is referred today because of snoring. His wife of 2 months notices that he is snoring and stops breathing in her sleep.  He has been told that he snores loudly.  He is occasionally tired during the day. Goes to bed between 1 to 3 am, he wakes once or twice at night, he is then out of bed at 8:30.  When wakes does not feel rested.  No sleep walking no sleep attacks, sleep paralysis.  He takes [place], on it for 2 - 3 years, takes it every night. If he does not take it, he has trouble taking it. He also takes fluoxetine for anxiety.  PMHX:   Past Medical History  Diagnosis Date  . GERD (gastroesophageal reflux disease)   . Hyperlipidemia   . Hypertension    Surgical Hx:  Past Surgical History  Procedure Laterality Date  . Cholecystectomy, laparoscopic  2000  . Tonsillectomy and adenoidectomy  1960's  . Vasectomy  1987  . Vasectomy reversal  1994  . Keratotomy  1995  . Cardiac catheterization      [facility];[place]   Family Hx:  Family History  Problem Relation Age of Onset  . Cancer Mother   . Cancer Father     lymphoma  . Hypertension Father   . Hyperlipidemia Father    Social Hx:   Social History    Substance Use Topics  . Smoking status: Never Smoker   . Smokeless tobacco: Not on file  . Alcohol Use: 5.0 oz/week    10 drink(s) per week     Comment: daily   Medication:   Current Outpatient Rx  Name  Route  Sig  Dispense  Refill  . aspirin 81 MG tablet   Oral   Take 81 mg by mouth daily.         . Diclofenac Sodium 2 % SOLN      Apply 1 pump twice daily.   112 g   3     [phone] (M)   . FLUoxetine (PROZAC) 20 MG tablet   Oral   Take 1 tablet (20 mg total) by mouth daily.   90 tablet   0     Office visit required for additional refills   . ibuprofen (ADVIL,MOTRIN) 800 MG tablet   Oral   Take 1 tablet (800 mg total) by mouth every 8 (eight) hours as needed.   90 tablet   0   . meloxicam (MOBIC) 15 MG tablet   Oral   Take 1 tablet (15 mg total) by mouth daily.   90 tablet   1   . metaxalone (SKELAXIN) 800 MG tablet   Oral   Take 1 tablet (800 mg total) by mouth  3 (three) times daily.   90 tablet   0   . olmesartan (BENICAR) 20 MG tablet   Oral   Take 1 tablet (20 mg total) by mouth daily.   90 tablet   3   . omeprazole (PRILOSEC) 20 MG capsule   Oral   Take 1 capsule (20 mg total) by mouth daily as needed.   90 capsule   3   . ranitidine (ZANTAC) 150 MG capsule      Take 1 capsule (150 mg total) by mouth 2 (two) times daily as needed for heartburn.   90 capsule   3   . rosuvastatin (CRESTOR) 40 MG tablet   Oral   Take 1 tablet (40 mg total) by mouth at bedtime.   90 tablet   1   . tadalafil (CIALIS) 5 MG tablet   Oral   Take 1 tablet (5 mg total) by mouth daily.   90 tablet   1   . zolpidem (AMBIEN) 10 MG tablet      TAKE 1 TABLET BY MOUTH AT BEDTIME   30 tablet   0     Not to exceed 5 additional fills before 12/11/2015 ...       Allergies:  Lisinopril  Review of Systems: Gen:  Denies  fever, sweats, chills HEENT: Denies blurred vision, double vision.  Cvc:  No dizziness, chest pain. Resp:   Denies cough or  sputum porduction,  Gi: Denies swallowing difficulty, stomach pain. Gu:  Denies bladder incontinence, burning urine Ext:   No Joint pain, stiffness. Skin: No skin rash,  hives Endoc:  No polyuria, polydipsia. Psych: No depression, insomnia. Other:  All other systems were reviewed with the patient and were negative other that what is mentioned in the HPI.   Physical Examination:   VS: BP 122/62 mmHg  Pulse 78  Ht 6\' 1"  (1.854 m)  Wt 229 lb (103.874 kg)  BMI 30.22 kg/m2  SpO2 99%  General Appearance: No distress  Neuro:without focal findings,  speech normal,  HEENT: PERRLA, EOM intact.  [place] 3.  Pulmonary: normal breath sounds, No wheezing.  CardiovascularNormal S1,S2.  No m/r/g.   Abdomen: Benign, Soft, non-tender. Renal:  No costovertebral tenderness  GU:  No performed at this time. Endoc: No evident thyromegaly, no signs of acromegaly. Skin:   warm, no rashes, no ecchymosis  Extremities: normal, no cyanosis, clubbing.  Other findings:    LABORATORY PANEL:   CBC No results for input(s): WBC, HGB, HCT, PLT in the last 168 hours. ------------------------------------------------------------------------------------------------------------------  Chemistries  No results for input(s): NA, K, CL, CO2, GLUCOSE, BUN, CREATININE, CALCIUM, MG, AST, ALT, ALKPHOS, BILITOT in the last 168 hours.  Invalid input(s): GFRCGP ------------------------------------------------------------------------------------------------------------------  Cardiac Enzymes No results for input(s): TROPONINI in the last 168 hours. ------------------------------------------------------------  RADIOLOGY:  No results found.     Thank  you for the consultation and for allowing [facility] to assist in the care of your patient. Our recommendations are noted above.  Please contact us if we can be of further service.   Marda Stalker, MD.  Board Certified in Internal  Medicine, Pulmonary Medicine, [place], and Sleep Medicine.  [facility] Office Number: [phone]  Patricia Pesa, M.D.  Vilinda Boehringer, M.D.  Merton Border, M.D

## 2015-08-20 ENCOUNTER — Encounter: Payer: Self-pay | Admitting: Family Medicine

## 2015-08-22 ENCOUNTER — Ambulatory Visit: Payer: PRIVATE HEALTH INSURANCE | Admitting: Physician Assistant

## 2015-08-28 ENCOUNTER — Other Ambulatory Visit: Payer: Self-pay | Admitting: Family Medicine

## 2015-08-28 ENCOUNTER — Encounter: Payer: Self-pay | Admitting: Family Medicine

## 2015-08-28 MED ORDER — OMEPRAZOLE 20 MG PO CPDR: 20.0000 mg | DELAYED_RELEASE_CAPSULE | Freq: Every day | ORAL | Status: DC | PRN

## 2015-08-28 MED ORDER — ZOLPIDEM TARTRATE 10 MG PO TABS: 10.0000 mg | ORAL_TABLET | Freq: Every day | ORAL | Status: DC

## 2015-08-28 NOTE — Telephone Encounter (Signed)
Pt left v/m requesting cb on status of zolpidem refill.

## 2015-08-28 NOTE — Telephone Encounter (Signed)
rx called in to requested pharmacy 

## 2015-08-28 NOTE — Telephone Encounter (Signed)
Last f/u 06/2015-CPE 

## 2015-09-05 ENCOUNTER — Other Ambulatory Visit: Payer: Self-pay | Admitting: Family Medicine

## 2015-09-05 MED ORDER — IBUPROFEN 800 MG PO TABS: 800.0000 mg | ORAL_TABLET | Freq: Three times a day (TID) | ORAL | Status: DC | PRN

## 2015-09-06 MED ORDER — OMEPRAZOLE 20 MG PO CPDR: 20.0000 mg | DELAYED_RELEASE_CAPSULE | Freq: Every day | ORAL | Status: DC | PRN

## 2015-09-06 MED ORDER — RANITIDINE HCL 150 MG PO CAPS: ORAL_CAPSULE | ORAL | Status: DC

## 2015-09-06 NOTE — Addendum Note (Signed)
Addended by: Modena Nunnery on: 09/06/2015 08:30 AM   Modules accepted: Orders

## 2015-09-10 ENCOUNTER — Telehealth: Payer: Self-pay | Admitting: Family Medicine

## 2015-09-10 NOTE — Telephone Encounter (Signed)
[facility] - Client TELEPHONE ADVICE RECORD [facility]  Patient Name: Joseph Church  DOB: 1961/02/18    Initial Comment Caller states his eyes are yellow. Wants to know if he can have lab work ordered.    Nurse Assessment  Nurse: Wynetta Emery, RN, Baker Janus Date/Time Eilene Ghazi Time): 09/10/2015 5:34:36 PM  Confirm and document reason for call. If symptomatic, describe symptoms. ---Alyan is having the whites of his eyes yellow in color x 3 days denies any abdominal pain at this time. States he was vomiting on Saturday and had some subcongectivle hemorrhage secondary to vomiting and needs or is wanting to have lab work drawn  Has the patient traveled out of the country within the last 30 days? ---No  Does the patient have any new or worsening symptoms? ---Yes  Will a triage be completed? ---Yes  Related visit to physician within the last 2 weeks? ---No  Does the PT have any chronic conditions? (i.e. diabetes, asthma, etc.) ---No  Is this a behavioral health or substance abuse call? ---No     Guidelines    Guideline Title Affirmed Question Affirmed Notes  Jaundice Jaundice, but all triage questions negative    Final Disposition User   See Physician within 24 Hours Wynetta Emery, RN, Baker Janus    Comments  note; 900am 09/11/2015 Dr. Deborra Medina possible jaundice   Referrals  REFERRED TO PCP OFFICE   Disagree/Comply: Comply

## 2015-09-11 ENCOUNTER — Ambulatory Visit (INDEPENDENT_AMBULATORY_CARE_PROVIDER_SITE_OTHER): Payer: PRIVATE HEALTH INSURANCE | Admitting: Family Medicine

## 2015-09-11 ENCOUNTER — Encounter: Payer: Self-pay | Admitting: Family Medicine

## 2015-09-11 VITALS — BP 128/72 | HR 95 | Temp 97.6°F | Wt 225.8 lb

## 2015-09-11 DIAGNOSIS — R17 Unspecified jaundice: Secondary | ICD-10-CM | POA: Diagnosis not present

## 2015-09-11 LAB — COMPREHENSIVE METABOLIC PANEL
ALT: 23 U/L (ref 0–53)
AST: 21 U/L (ref 0–37)
Albumin: 4.1 g/dL (ref 3.5–5.2)
Alkaline Phosphatase: 61 U/L (ref 39–117)
BUN: 22 mg/dL (ref 6–23)
CHLORIDE: 102 meq/L (ref 96–112)
CO2: 27 meq/L (ref 19–32)
Calcium: 9.6 mg/dL (ref 8.4–10.5)
Creatinine, Ser: 1.03 mg/dL (ref 0.40–1.50)
GFR: 79.65 mL/min (ref >60.00)
GLUCOSE: 96 mg/dL (ref 70–99)
POTASSIUM: 3.8 meq/L (ref 3.5–5.1)
Sodium: 138 mEq/L (ref 135–145)
Total Bilirubin: 0.5 mg/dL (ref 0.2–1.2)
Total Protein: 6.8 g/dL (ref 6.0–8.3)

## 2015-09-11 LAB — AMYLASE: Amylase: 48 U/L (ref 27–131)

## 2015-09-11 LAB — LIPASE: Lipase: 46 U/L (ref 11.0–59.0)

## 2015-09-11 NOTE — Patient Instructions (Signed)
Great to see you. Happy Holidays.  We will call you with lab results.

## 2015-09-11 NOTE — Assessment & Plan Note (Signed)
Reassurance provided- I do not see jaundice or findings suggestive of scleral icterus. I suspect the yellowing is from the bruising he sustaining during retching and will resolve soon. Exam reassuring. Check labs today.  If symptoms persist, CT of abd/pelvis. The patient indicates understanding of these issues and agrees with the plan.

## 2015-09-11 NOTE — Telephone Encounter (Signed)
Pt has appt 09/11/15 at 9 AM Dr Deborra Medina.

## 2015-09-11 NOTE — Progress Notes (Signed)
Subjective:   Patient ID: Joseph Church, male    DOB: 06/14/1961, 54 y.o.   MRN: [id number]  Joseph Church is a pleasant 54 y.o. year old male who presents to clinic today with eye discoloration  on 09/11/2015  HPI:  Comes in for ? Scleral icterus/jaundice. 4 days ago, drank 3 bottles of wine at a holiday party.  That night, vomiting quite violently several times. Had bilateral subconjunctival hemorrhages and now those areas are turning a little yellow.  No skin jaundice.  No abdominal pain.  Comes in because his wife is concerned.  No black or bloody stools.  Current Outpatient Prescriptions on File Prior to Visit  Medication Sig Dispense Refill  . aspirin 81 MG tablet Take 81 mg by mouth daily.    . Diclofenac Sodium 2 % SOLN Apply 1 pump twice daily. 112 g 3  . FLUoxetine (PROZAC) 20 MG tablet Take 1 tablet (20 mg total) by mouth daily. 90 tablet 0  . ibuprofen (ADVIL,MOTRIN) 800 MG tablet Take 1 tablet (800 mg total) by mouth every 8 (eight) hours as needed. 90 tablet 0  . meloxicam (MOBIC) 15 MG tablet Take 1 tablet (15 mg total) by mouth daily. 90 tablet 1  . metaxalone (SKELAXIN) 800 MG tablet Take 1 tablet (800 mg total) by mouth 3 (three) times daily. 90 tablet 0  . olmesartan (BENICAR) 20 MG tablet Take 1 tablet (20 mg total) by mouth daily. 90 tablet 3  . omeprazole (PRILOSEC) 20 MG capsule Take 1 capsule (20 mg total) by mouth daily as needed. 90 capsule 3  . ranitidine (ZANTAC) 150 MG capsule Take 1 capsule (150 mg total) by mouth 2 (two) times daily as needed for heartburn. 90 capsule 3  . rosuvastatin (CRESTOR) 40 MG tablet Take 1 tablet (40 mg total) by mouth at bedtime. 90 tablet 1  . tadalafil (CIALIS) 5 MG tablet Take 1 tablet (5 mg total) by mouth daily. 90 tablet 1  . zolpidem (AMBIEN) 10 MG tablet Take 1 tablet (10 mg total) by mouth at bedtime. 30 tablet 0   No current facility-administered medications on file prior to visit.    Allergies  Allergen  Reactions  . Lisinopril Cough    Past Medical History  Diagnosis Date  . GERD (gastroesophageal reflux disease)   . Hyperlipidemia   . Hypertension     Past Surgical History  Procedure Laterality Date  . Cholecystectomy, laparoscopic  2000  . Tonsillectomy and adenoidectomy  1960's  . Vasectomy  1987  . Vasectomy reversal  1994  . Keratotomy  1995  . Cardiac catheterization      [facility];[place]    Family History  Problem Relation Age of Onset  . Cancer Mother   . Cancer Father     lymphoma  . Hypertension Father   . Hyperlipidemia Father     Social History   Social History  . Marital Status: Divorced    Spouse Name: N/A  . Number of Children: N/A  . Years of Education: N/A   Occupational History  . Not on file.   Social History Main Topics  . Smoking status: Never Smoker   . Smokeless tobacco: Not on file  . Alcohol Use: 5.0 oz/week    10 drink(s) per week     Comment: daily  . Drug Use: No  . Sexual Activity: Not on file   Other Topics Concern  . 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   Review of Systems  Constitutional: Negative.   Eyes: Positive for redness. Negative for photophobia, pain, itching and visual disturbance.  Gastrointestinal: Negative.   Genitourinary: Negative.   Musculoskeletal: Negative.   Skin: Negative for color change and pallor.  Neurological: Negative.   Hematological: Negative.   All other systems reviewed and are negative.      Objective:    BP 128/72 mmHg  Pulse 95  Temp(Src) 97.6 F (36.4 C) (Oral)  Wt 225 lb 12 oz (102.4 kg)  SpO2 97%   Physical Exam  Constitutional: He is oriented to person, place, and time. He appears  well-developed and well-nourished. No distress.  HENT:  Head: Normocephalic.  Eyes:    Cardiovascular: Normal rate.   Pulmonary/Chest: Effort normal.  Abdominal: Soft. Bowel sounds are normal. He exhibits no distension and no mass. There is no tenderness. There is no rebound and no guarding.  Musculoskeletal: Normal range of motion.  Neurological: He is alert and oriented to person, place, and time. No cranial nerve deficit.  Skin: Skin is warm and dry. He is not diaphoretic. No erythema. No pallor.  No obvious jaundice  Psychiatric: He has a normal mood and affect. His behavior is normal. Judgment and thought content normal.  Nursing note and vitals reviewed.         Assessment & Plan:   Jaundice - Plan: Comprehensive metabolic panel, INR, Hepatitis panel, acute, Amylase, Lipase No Follow-up on file.

## 2015-09-11 NOTE — Progress Notes (Signed)
Pre visit review using our clinic review tool, if applicable. No additional management support is needed unless otherwise documented below in the visit note. 

## 2015-09-12 LAB — HEPATITIS PANEL, ACUTE
HCV Ab: NEGATIVE
HEP A IGM: NONREACTIVE
HEP B C IGM: NONREACTIVE
HEP B S AG: NEGATIVE

## 2015-09-24 ENCOUNTER — Other Ambulatory Visit: Payer: Self-pay | Admitting: Family Medicine

## 2015-09-24 MED ORDER — FLUOXETINE HCL 20 MG PO TABS: 20.0000 mg | ORAL_TABLET | Freq: Every day | ORAL | Status: DC

## 2015-09-30 ENCOUNTER — Ambulatory Visit: Payer: PRIVATE HEALTH INSURANCE | Admitting: Cardiovascular Disease

## 2015-10-01 ENCOUNTER — Other Ambulatory Visit: Payer: Self-pay | Admitting: Family Medicine

## 2015-10-01 MED ORDER — ZOLPIDEM TARTRATE 10 MG PO TABS: 10.0000 mg | ORAL_TABLET | Freq: Every day | ORAL | Status: DC

## 2015-10-01 NOTE — Telephone Encounter (Signed)
Pt left v/m requesting cb with status of zolpidem refill.

## 2015-10-01 NOTE — Telephone Encounter (Signed)
Received refill request for Ambien Last refill 08/28/15 #30 Last office visit 09/11/15

## 2015-10-01 NOTE — Telephone Encounter (Signed)
Please verify that 2 refills are okay to call in.

## 2015-10-02 NOTE — Telephone Encounter (Signed)
Pt left v/m requesting status of ambien rx.pt request cb.

## 2015-10-02 NOTE — Telephone Encounter (Signed)
I approved refill yesterday

## 2015-10-02 NOTE — Telephone Encounter (Signed)
Rx called in to requested pharmacy 

## 2015-10-16 ENCOUNTER — Ambulatory Visit (INDEPENDENT_AMBULATORY_CARE_PROVIDER_SITE_OTHER): Payer: PRIVATE HEALTH INSURANCE | Admitting: Family Medicine

## 2015-10-16 ENCOUNTER — Encounter: Payer: Self-pay | Admitting: Family Medicine

## 2015-10-16 ENCOUNTER — Other Ambulatory Visit (INDEPENDENT_AMBULATORY_CARE_PROVIDER_SITE_OTHER): Payer: PRIVATE HEALTH INSURANCE

## 2015-10-16 VITALS — BP 118/82 | HR 77 | Wt 227.0 lb

## 2015-10-16 DIAGNOSIS — M7711 Lateral epicondylitis, right elbow: Secondary | ICD-10-CM | POA: Diagnosis not present

## 2015-10-16 DIAGNOSIS — M67912 Unspecified disorder of synovium and tendon, left shoulder: Secondary | ICD-10-CM | POA: Diagnosis not present

## 2015-10-16 DIAGNOSIS — M25512 Pain in left shoulder: Secondary | ICD-10-CM

## 2015-10-16 NOTE — Assessment & Plan Note (Signed)
Patient had a repeat injection today. We discussed icing regimen and home exercises. We discussed which activities to do an which was potentially avoid. Patient and will come back and see me again in 3-4 weeks. If forcing symptoms we may need to consider further evaluation such as an MRI. May also need to consider repeat of formal physical therapy.

## 2015-10-16 NOTE — Patient Instructions (Signed)
Good to see you  Injected shoulder again Ice 20 minutes 2 times daily. Usually after activity and before bed. For the elbow, wear wrist brace day and night for 1 week then nightly for 2 weeks.  Exercises 3 times a week.  Duexis 3 times daily for 6 days but stop meloxicam at the same time.  Pick up everything underhand or thumbs up See me again in 3 weeks.

## 2015-10-16 NOTE — Assessment & Plan Note (Signed)
Lateral Epicondylitis: Elbow anatomy was reviewed, and tendinopathy was explained.  Pt. given a formal rehab program. Series of concentric and eccentric exercises should be done starting with no weight, work up to 1 lb, hammer, etc.  Use counterforce strap if working or using hands.  Formal PT would be beneficial. Emphasized stretching an cross-friction massage Emphasized proper palms up lifting biomechanics to unload ECRB  

## 2015-10-16 NOTE — Progress Notes (Signed)
[facility] [address] [place], [place] [address] Phone: [phone] Subjective:    I'm seeing this patient by the request  of:  Arnette Norris, MD   CC: Shoulder pain left  Right elbow pain  [id number] Joseph Church is a 55 y.o. male coming in with complaint of left shoulder pain. Patient was found to have some impingement syndromes 5 months ago. Was given an injection was pain free of the last couple weeks. Now having increasing pain again. Describes it as a dull, throbbing aching sensation once again worse with certain movements that can cause a severe pain that only last seconds. No numbness radiating down the arm.  Patient is also complaining of right elbow pain. More of an insidious onset. Worse with repetitive activity. If he tries to lift something overhead and he has severe pain at the elbow. States that he is noticed some swelling in the area. This does not remember any specific injury. Rates the severity of pain a 8 out of 10. No numbness but states that it feeling weak but he does not know if it is from the pain or true weakness. Has not tried any home modalities  Past Medical History  Diagnosis Date  . GERD (gastroesophageal reflux disease)   . Hyperlipidemia   . Hypertension    Past Surgical History  Procedure Laterality Date  . Cholecystectomy, laparoscopic  2000  . Tonsillectomy and adenoidectomy  1960's  . Vasectomy  1987  . Vasectomy reversal  1994  . Keratotomy  1995  . Cardiac catheterization      [facility];[place]   Social History  Substance Use Topics  . Smoking status: Never Smoker   . Smokeless tobacco: Not on file  . Alcohol Use: 5.0 oz/week    10 drink(s) per week     Comment: daily   Allergies  Allergen Reactions  . Lisinopril Cough   Family History  Problem Relation Age of Onset  . Cancer Mother   . Cancer Father     lymphoma  . Hypertension Father   . Hyperlipidemia Father          Past medical history, social, surgical and family history all reviewed in electronic medical record.   Review of Systems: No headache, visual changes, nausea, vomiting, diarrhea, constipation, dizziness, abdominal pain, skin rash, fevers, chills, night sweats, weight loss, swollen lymph nodes, body aches, joint swelling, muscle aches, chest pain, shortness of breath, mood changes.   Objective Blood pressure 118/82, pulse 77, weight 227 lb (102.967 kg), SpO2 98 %.  General: No apparent distress alert and oriented x3 mood and affect normal, dressed appropriately.  HEENT: Pupils equal, extraocular movements intact  Respiratory: Patient's speak in full sentences and does not appear short of breath  Cardiovascular: No lower extremity edema, non tender, no erythema  Skin: Warm dry intact with no signs of infection or rash on extremities or on axial skeleton.  Abdomen: Soft nontender  Neuro: Cranial nerves II through XII are intact, neurovascularly intact in all extremities with 2+ DTRs and 2+ pulses.  Lymph: No lymphadenopathy of posterior or anterior cervical chain or axillae bilaterally.  Gait normal with good balance and coordination.  MSK:  Non tender with full range of motion and good stability and symmetric strength and tone of   wrist, hip, knee and ankles bilaterally.   Elbow: Right Patient shows some inflammation over the lateral epicondylar region Range of motion full pronation, supination, flexion,  extension. Strength is full to all of the above directions Stable to varus, valgus stress. Negative moving valgus stress test. Tender to palpation over the lateral region. Ulnar nerve does not sublux. Negative cubital tunnel Tinel's. Contralateral elbow unremarkable  Shoulder: left Inspection reveals no abnormalities, atrophy or asymmetry. Palpation is normal with no tenderness over AC joint or bicipital groove. ROM is full in all planes passively. Rotator cuff strength  normal throughout. signs of impingement with positive Neer and Hawkin's tests, but negative empty can sign. Speeds and Yergason's tests normal. No labral pathology noted with negative Obrien's, negative clunk and good stability. Normal scapular function observed. No painful arc and no drop arm sign. No apprehension sign  MSK US performed of: left This study was ordered, performed, and interpreted by Charlann Boxer D.O.  Shoulder:   Supraspinatus:  Appears normal on long and transverse views, Bursal bulge seen with shoulder abduction on impingement view. Infraspinatus:  Appears normal on long and transverse views. Significant increase in Doppler flow Subscapularis:  Appears normal on long and transverse views. Positive bursa Teres Minor:  Appears normal on long and transverse views. AC joint:  Capsule undistended, no geyser sign. Glenohumeral Joint:  Appears normal without effusion. Glenoid Labrum:  Intact without visualized tears. Biceps Tendon:  Appears normal on long and transverse views, no fraying of tendon, tendon located in intertubercular groove, no subluxation with shoulder internal or external rotation.  Impression: Subacromial bursitis, mild rotator cuff tendinopathy  Procedure: Real-time Ultrasound Guided Injection of left glenohumeral joint Device: GE Logiq E  Ultrasound guided injection is preferred based studies that show increased duration, increased effect, greater accuracy, decreased procedural pain, increased response rate with ultrasound guided versus blind injection.  Verbal informed consent obtained.  Time-out conducted.  Noted no overlying erythema, induration, or other signs of local infection.  Skin prepped in a sterile fashion.  Local anesthesia: Topical Ethyl chloride.  With sterile technique and under real time ultrasound guidance:  Joint visualized.  23g 1  inch needle inserted posterior approach. Pictures taken for needle placement. Patient did have injection  of 2 cc of 1% lidocaine, 2 cc of 0.5% Marcaine, and 1.0 cc of Kenalog 40 mg/dL. Completed without difficulty  Pain immediately resolved suggesting accurate placement of the medication.  Advised to call if fevers/chills, erythema, induration, drainage, or persistent bleeding.  Images permanently stored and available for review in the ultrasound unit.  Impression: Technically successful ultrasound guided injection.  Procedure note [id number]; 15 minutes spent for Therapeutic exercises as stated in above notes.  This included exercises focusing on stretching, strengthening, with significant focus on eccentric aspects.  Patient given exercises for the elbow focusing on eccentric server's extension as well as in flexion and abduction. Rows with theraband Proper technique shown and discussed handout in great detail with ATC.  All questions were discussed and answered.      Impression and Recommendations:     This case required medical decision making of moderate complexity.

## 2015-10-17 DIAGNOSIS — G4733 Obstructive sleep apnea (adult) (pediatric): Secondary | ICD-10-CM | POA: Diagnosis not present

## 2015-10-23 DIAGNOSIS — G4733 Obstructive sleep apnea (adult) (pediatric): Secondary | ICD-10-CM

## 2015-10-24 ENCOUNTER — Other Ambulatory Visit: Payer: Self-pay | Admitting: *Deleted

## 2015-10-24 DIAGNOSIS — G4719 Other hypersomnia: Secondary | ICD-10-CM

## 2015-10-28 ENCOUNTER — Ambulatory Visit (INDEPENDENT_AMBULATORY_CARE_PROVIDER_SITE_OTHER): Payer: PRIVATE HEALTH INSURANCE | Admitting: Internal Medicine

## 2015-10-28 ENCOUNTER — Encounter: Payer: Self-pay | Admitting: Internal Medicine

## 2015-10-28 VITALS — BP 126/74 | HR 107 | Ht 73.0 in | Wt 229.6 lb

## 2015-10-28 DIAGNOSIS — G4719 Other hypersomnia: Secondary | ICD-10-CM

## 2015-10-28 DIAGNOSIS — G4733 Obstructive sleep apnea (adult) (pediatric): Secondary | ICD-10-CM

## 2015-10-28 DIAGNOSIS — G4726 Circadian rhythm sleep disorder, shift work type: Secondary | ICD-10-CM

## 2015-10-28 NOTE — Progress Notes (Signed)
* [facility]     Assessment and Plan:  Obstructive sleep apnea. -Home sleep study showed an AHI of 20. -We'll send for an auto Pap low pressure of 5, high pressure of 15, and review the results when available.  Shiftwork sleep disorder. -Patient works variable shifts, likely contributing to his daytime sleepiness. -We will monitor for improvement after the patient started on CPAP, if his daytime sleepiness is excessive. We can discuss the possibility of adding medication for this in the future.  Excessive daytime sleepiness due to obstructive sleep apnea. -Symptoms and signs of obstructive sleep apnea. -Patient is also on fluoxetine, which could be contributory.  Snoring -Snoring, with witnessed apneas.  Insomnia.  -Currently controlled with Ambien.  Anxiety/Depression.  -Currently on fluoxetine.   Date: 10/28/2015  MRN# [id number] VA CHUKWU 1961-02-07   Joseph Church is a 55 y.o. old male seen in follow up for chief complaint of  Chief Complaint  Patient presents with  . Follow-up    sleep study results. no new concerns today.    HPI:   The patient is a 55 year old male, who was previously seen with complaints of excessive daytime sleepiness, suspicious for sleep apnea. He was sent for a home sleep study, which showed moderate obstructive sleep apnea with an AHI of 20.  -Sleep study and tracings from home test 10/17/2015 were reviewed, this showed an AHI of 20  Medication:   Outpatient Encounter Prescriptions as of 10/28/2015  Medication Sig  . aspirin 81 MG tablet Take 81 mg by mouth daily.  . Diclofenac Sodium 2 % SOLN Apply 1 pump twice daily.  Marland Kitchen FLUoxetine (PROZAC) 20 MG tablet Take 1 tablet (20 mg total) by mouth daily.  Marland Kitchen ibuprofen (ADVIL,MOTRIN) 800 MG tablet Take 1 tablet (800 mg total) by mouth every 8 (eight) hours as needed.  . meloxicam (MOBIC) 15 MG tablet Take 1 tablet (15 mg total) by mouth daily.  . metaxalone (SKELAXIN)  800 MG tablet Take 1 tablet (800 mg total) by mouth 3 (three) times daily.  Marland Kitchen olmesartan (BENICAR) 20 MG tablet Take 1 tablet (20 mg total) by mouth daily.  Marland Kitchen omeprazole (PRILOSEC) 20 MG capsule Take 1 capsule (20 mg total) by mouth daily as needed.  . ranitidine (ZANTAC) 150 MG capsule Take 1 capsule (150 mg total) by mouth 2 (two) times daily as needed for heartburn.  . rosuvastatin (CRESTOR) 40 MG tablet Take 1 tablet (40 mg total) by mouth at bedtime.  . tadalafil (CIALIS) 5 MG tablet Take 1 tablet (5 mg total) by mouth daily.  Marland Kitchen zolpidem (AMBIEN) 10 MG tablet Take 1 tablet (10 mg total) by mouth at bedtime.   No facility-administered encounter medications on file as of 10/28/2015.     Allergies:  Lisinopril  Review of Systems: Gen:  Denies  fever, sweats. HEENT: Denies blurred vision. Cvc:  No dizziness, chest pain or heaviness Resp:   Denies cough or sputum porduction. Gi: Denies swallowing difficulty,  Gu:  Denies bladder incontinence, burning urine Ext:   No Joint pain, stiffness. Skin: No skin rash, easy bruising. Endoc:  No polyuria, polydipsia. Psych: No depression, insomnia. Other:  All other systems were reviewed and found to be negative other than what is mentioned in the HPI.   Physical Examination:   VS: BP 126/74 mmHg  Pulse 107  Ht 6\' 1"  (1.854 m)  Wt 229 lb 9.6 oz (104.146 kg)  BMI 30.30 kg/m2  SpO2 98%  General Appearance:  No distress  Neuro:without focal findings,  speech normal,  HEENT: PERRLA, EOM intact. Pulmonary: normal breath sounds, No wheezing.   CardiovascularNormal S1,S2.  No m/r/g.   Abdomen: Benign, Soft, non-tender. Renal:  No costovertebral tenderness  GU:  Not performed at this time. Endoc: No evident thyromegaly, no signs of acromegaly. Skin:   warm, no rash. Extremities: normal, no cyanosis, clubbing.   LABORATORY PANEL:   CBC No results for input(s): WBC, HGB, HCT, PLT in the last 168  hours. ------------------------------------------------------------------------------------------------------------------  Chemistries  No results for input(s): NA, K, CL, CO2, GLUCOSE, BUN, CREATININE, CALCIUM, MG, AST, ALT, ALKPHOS, BILITOT in the last 168 hours.  Invalid input(s): GFRCGP ------------------------------------------------------------------------------------------------------------------  Cardiac Enzymes No results for input(s): TROPONINI in the last 168 hours. ------------------------------------------------------------  RADIOLOGY:   No results found for this or any previous visit. Results for orders placed during the hospital encounter of 07/04/15  DG Chest 2 View   Narrative CLINICAL DATA:  Left chest pain and pressure. Nausea and lightheadedness.  EXAM: CHEST  2 VIEW  COMPARISON:  None.  FINDINGS: The heart size and mediastinal contours are within normal limits. Both lungs are clear. The visualized skeletal structures are unremarkable.  IMPRESSION: No active cardiopulmonary disease.   Electronically Signed   By: Van Clines M.D.   On: 07/04/2015 17:39    ------------------------------------------------------------------------------------------------------------------  Thank  you for allowing [facility] to assist in the care of your patient. Our recommendations are noted above.  Please contact us if we can be of further service.   Marda Stalker, MD.  [facility]  Patricia Pesa, M.D.  Vilinda Boehringer, M.D.  Merton Border, M.D

## 2015-10-28 NOTE — Addendum Note (Signed)
Addended by: Maryanna Shape A on: 10/28/2015 10:38 AM   Modules accepted: Orders

## 2015-10-28 NOTE — Patient Instructions (Addendum)
--  We'll start an auto titrating CPAP, low pressure of 5, high pressure of 15.   Sleep Apnea Sleep apnea is disorder that affects a person's sleep. A person with sleep apnea has abnormal pauses in their breathing when they sleep. It is hard for them to get a good sleep. This makes a person tired during the day. It also can lead to other physical problems. There are three types of sleep apnea. One type is when breathing stops for a short time because your airway is blocked (obstructive sleep apnea). Another type is when the brain sometimes fails to give the normal signal to breathe to the muscles that control your breathing (central sleep apnea). The third type is a combination of the other two types. HOME CARE   Take all medicine as told by your doctor.  Avoid alcohol, calming medicines (sedatives), and depressant drugs.  Try to lose weight if you are overweight. Talk to your doctor about a healthy weight goal.  Your doctor may have you use a device that helps to open your airway. It can help you get the air that you need. It is called a positive airway pressure (PAP) device.   MAKE SURE YOU:   Understand these instructions.  Will watch your condition.  Will get help right away if you are not doing well or get worse.  It may take approximately 1 month for you to get used to wearing her CPAP every night.

## 2015-10-29 ENCOUNTER — Other Ambulatory Visit: Payer: Self-pay | Admitting: Family Medicine

## 2015-10-29 MED ORDER — ROSUVASTATIN CALCIUM 40 MG PO TABS: 40.0000 mg | ORAL_TABLET | Freq: Every day | ORAL | Status: DC

## 2015-10-29 MED ORDER — ZOLPIDEM TARTRATE 10 MG PO TABS: 10.0000 mg | ORAL_TABLET | Freq: Every day | ORAL | Status: DC

## 2015-10-29 NOTE — Telephone Encounter (Signed)
Rx called in to requested pharmacy 

## 2015-10-29 NOTE — Addendum Note (Signed)
Addended by: Modena Nunnery on: 10/29/2015 04:11 PM   Modules accepted: Orders

## 2015-10-29 NOTE — Telephone Encounter (Signed)
Last f/u 06/2015 

## 2015-11-03 ENCOUNTER — Emergency Department
Admission: EM | Admit: 2015-11-03 | Discharge: 2015-11-03 | Disposition: A | Discharge status: Home / Self Care | Payer: PRIVATE HEALTH INSURANCE | Attending: Emergency Medicine | Admitting: Emergency Medicine

## 2015-11-03 ENCOUNTER — Encounter: Payer: Self-pay | Admitting: Emergency Medicine

## 2015-11-03 ENCOUNTER — Emergency Department: Payer: PRIVATE HEALTH INSURANCE

## 2015-11-03 DIAGNOSIS — Z792 Long term (current) use of antibiotics: Secondary | ICD-10-CM | POA: Insufficient documentation

## 2015-11-03 DIAGNOSIS — R509 Fever, unspecified: Secondary | ICD-10-CM | POA: Diagnosis present

## 2015-11-03 DIAGNOSIS — J189 Pneumonia, unspecified organism: Secondary | ICD-10-CM | POA: Diagnosis not present

## 2015-11-03 DIAGNOSIS — Z79899 Other long term (current) drug therapy: Secondary | ICD-10-CM | POA: Insufficient documentation

## 2015-11-03 DIAGNOSIS — I10 Essential (primary) hypertension: Secondary | ICD-10-CM | POA: Insufficient documentation

## 2015-11-03 DIAGNOSIS — Z791 Long term (current) use of non-steroidal anti-inflammatories (NSAID): Secondary | ICD-10-CM | POA: Insufficient documentation

## 2015-11-03 DIAGNOSIS — Z7982 Long term (current) use of aspirin: Secondary | ICD-10-CM | POA: Diagnosis not present

## 2015-11-03 DIAGNOSIS — J181 Lobar pneumonia, unspecified organism: Secondary | ICD-10-CM

## 2015-11-03 LAB — CBC WITH DIFFERENTIAL/PLATELET
Basophils Absolute: 0 10*3/uL (ref 0–0.1)
Basophils Relative: 0 %
EOS ABS: 0.2 10*3/uL (ref 0–0.7)
EOS PCT: 3 %
HEMATOCRIT: 43.4 % (ref 40.0–52.0)
HEMOGLOBIN: 14.9 g/dL (ref 13.0–18.0)
LYMPHS ABS: 0.7 10*3/uL — AB (ref 1.0–3.6)
LYMPHS PCT: 10 %
MCH: 28.9 pg (ref 26.0–34.0)
MCHC: 34.4 g/dL (ref 32.0–36.0)
MCV: 84.2 fL (ref 80.0–100.0)
MONOS PCT: 10 %
Monocytes Absolute: 0.7 10*3/uL (ref 0.2–1.0)
Neutro Abs: 5.2 10*3/uL (ref 1.4–6.5)
Neutrophils Relative %: 77 %
Platelets: 218 10*3/uL (ref 150–440)
RBC: 5.16 MIL/uL (ref 4.40–5.90)
RDW: 13.9 % (ref 11.5–14.5)
WBC: 6.8 10*3/uL (ref 3.8–10.6)

## 2015-11-03 LAB — COMPREHENSIVE METABOLIC PANEL
ALBUMIN: 3.8 g/dL (ref 3.5–5.0)
ALT: 23 U/L (ref 17–63)
ANION GAP: 9 (ref 5–15)
AST: 23 U/L (ref 15–41)
Alkaline Phosphatase: 70 U/L (ref 38–126)
BUN: 20 mg/dL (ref 6–20)
CHLORIDE: 102 mmol/L (ref 101–111)
CO2: 26 mmol/L (ref 22–32)
CREATININE: 1.22 mg/dL (ref 0.61–1.24)
Calcium: 9.1 mg/dL (ref 8.9–10.3)
Glucose, Bld: 96 mg/dL (ref 65–99)
Potassium: 4.3 mmol/L (ref 3.5–5.1)
SODIUM: 137 mmol/L (ref 135–145)
Total Bilirubin: 0.7 mg/dL (ref 0.3–1.2)
Total Protein: 7.4 g/dL (ref 6.5–8.1)

## 2015-11-03 LAB — RAPID INFLUENZA A&B ANTIGENS
Influenza A (ARMC): NEGATIVE
Influenza B (ARMC): NEGATIVE

## 2015-11-03 MED ORDER — HYDROCOD POLST-CPM POLST ER 10-8 MG/5ML PO SUER: 5.0000 mL | Freq: Two times a day (BID) | ORAL | Status: DC | PRN

## 2015-11-03 MED ORDER — LEVOFLOXACIN 500 MG PO TABS: 500.0000 mg | ORAL_TABLET | Freq: Every day | ORAL | Status: AC

## 2015-11-03 MED ORDER — SODIUM CHLORIDE 0.9 % IV BOLUS (SEPSIS)
1000.0000 mL | Freq: Once | INTRAVENOUS | Status: AC
  Administered 2015-11-03: 1000 mL via INTRAVENOUS

## 2015-11-03 MED ORDER — LEVOFLOXACIN IN D5W 750 MG/150ML IV SOLN
750.0000 mg | Freq: Once | INTRAVENOUS | Status: AC
  Administered 2015-11-03: 750 mg via INTRAVENOUS
  Filled 2015-11-03: qty 150

## 2015-11-03 NOTE — ED Provider Notes (Signed)
[facility] Emergency Department Provider Note  ____________________________________________  Time seen: Approximately 3:44 PM  I have reviewed the triage vital signs and the nursing notes.   HISTORY  Chief Complaint Dehydration and Fever   HPI Joseph Church is a 54 y.o. male is here with history of fever, chills and body aches for 4 days. Patient left work on Wednesday night and had sudden onset of symptoms. He had some leftover Tamiflu that had expired in 2002 that began taking it that night. Patient has remained in bed since that time and has had shaking chills, decreased appetite, and periods of clamminess. Patient denies any vomiting or diarrhea. Patient has had a nonproductive cough and states his chest hurts with coughing.   Past Medical History  Diagnosis Date  . GERD (gastroesophageal reflux disease)   . Hyperlipidemia   . Hypertension     Patient Active Problem List   Diagnosis Date Noted  . Lateral epicondylitis of right elbow 10/16/2015  . Jaundice 09/11/2015  . Benign colon polyp 05/13/2015  . Cervical spine pain 07/10/2014  . Insomnia 07/07/2013  . Erectile dysfunction 07/07/2013  . GERD (gastroesophageal reflux disease)   . Hyperlipidemia   . Hypertension     Past Surgical History  Procedure Laterality Date  . Cholecystectomy, laparoscopic  2000  . Tonsillectomy and adenoidectomy  1960's  . Vasectomy  1987  . Vasectomy reversal  1994  . Keratotomy  1995  . Cardiac catheterization      [facility]    Current Outpatient Rx  Name  Route  Sig  Dispense  Refill  . aspirin 81 MG tablet   Oral   Take 81 mg by mouth daily.         . chlorpheniramine-HYDROcodone (TUSSIONEX PENNKINETIC ER) 10-8 MG/5ML SUER   Oral   Take 5 mLs by mouth every 12 (twelve) hours as needed for cough.   140 mL   0   . Diclofenac Sodium 2 % SOLN      Apply 1 pump twice daily.   112 g   3     [phone]  (M)   . FLUoxetine (PROZAC) 20 MG tablet   Oral   Take 1 tablet (20 mg total) by mouth daily.   90 tablet   3   . ibuprofen (ADVIL,MOTRIN) 800 MG tablet   Oral   Take 1 tablet (800 mg total) by mouth every 8 (eight) hours as needed.   90 tablet   0   . levofloxacin (LEVAQUIN) 500 MG tablet   Oral   Take 1 tablet (500 mg total) by mouth daily.   10 tablet   0   . meloxicam (MOBIC) 15 MG tablet   Oral   Take 1 tablet (15 mg total) by mouth daily.   90 tablet   1   . metaxalone (SKELAXIN) 800 MG tablet   Oral   Take 1 tablet (800 mg total) by mouth 3 (three) times daily.   90 tablet   0   . olmesartan (BENICAR) 20 MG tablet   Oral   Take 1 tablet (20 mg total) by mouth daily.   90 tablet   3   . omeprazole (PRILOSEC) 20 MG capsule   Oral   Take 1 capsule (20 mg total) by mouth daily as needed.   90 capsule   3   . ranitidine (ZANTAC) 150 MG capsule      Take 1 capsule (150 mg  total) by mouth 2 (two) times daily as needed for heartburn.   90 capsule   3   . rosuvastatin (CRESTOR) 40 MG tablet   Oral   Take 1 tablet (40 mg total) by mouth at bedtime.   90 tablet   1   . tadalafil (CIALIS) 5 MG tablet   Oral   Take 1 tablet (5 mg total) by mouth daily.   90 tablet   1   . zolpidem (AMBIEN) 10 MG tablet   Oral   Take 1 tablet (10 mg total) by mouth at bedtime.   30 tablet   0     Not to exceed 5 additional fills before 12/11/2015 ...     Allergies Lisinopril  Family History  Problem Relation Age of Onset  . Cancer Mother   . Cancer Father     lymphoma  . Hypertension Father   . Hyperlipidemia Father     Social History Social History  Substance Use Topics  . Smoking status: Never Smoker   . Smokeless tobacco: None  . Alcohol Use: 5.0 oz/week    10 drink(s) per week     Comment: daily    Review of Systems Constitutional: Positive fever/chills Eyes: No visual changes. ENT: No sore throat. Cardiovascular: Denies chest  pain. Respiratory: Positive shortness of breath. Has a productive cough Gastrointestinal: No abdominal pain.  No nausea, no vomiting.  No diarrhea.   Musculoskeletal: Negative for back pain. Skin: Negative for rash. Neurological: Positive for headaches, focal weakness or numbness.  10-point ROS otherwise negative.  ____________________________________________   PHYSICAL EXAM:  VITAL SIGNS: Joseph Triage Vitals  Enc Vitals Group     BP --      Pulse --      Resp --      Temp --      Temp src --      SpO2 --      Weight --      Height --      Head Cir --      Peak Flow --      Pain Score --      Pain Loc --      Pain Edu? --      Excl. in [place]? --     Constitutional: Alert and oriented. Well appearing and in no acute distress. Eyes: Conjunctivae are normal. PERRL. EOMI. Head: Atraumatic. Nose: No congestion/rhinnorhea. Mouth/Throat: Mucous membranes are moist.  Oropharynx non-erythematous. Neck: No stridor.   Cardiovascular: Normal rate, regular rhythm. Grossly normal heart sounds.  Good peripheral circulation. Respiratory: Normal respiratory effort.  No retractions. Rales heard in the right lower base. Decreased breath sounds are heard throughout. No wheezing was involved. Gastrointestinal: Soft and nontender. No distention.  Musculoskeletal: No lower extremity tenderness nor edema.  No joint effusions. Neurologic:  Normal speech and language. No gross focal neurologic deficits are appreciated. No gait instability. Skin:  Skin is warm, dry and intact. No rash noted. Psychiatric: Mood and affect are normal. Speech and behavior are normal.  ____________________________________________   LABS (all labs ordered are listed, but only abnormal results are displayed)  Labs Reviewed  CBC WITH DIFFERENTIAL/PLATELET - Abnormal; Notable for the following:    Lymphs Abs 0.7 (*)    All other components within normal limits  RAPID INFLUENZA A&B ANTIGENS ([facility] ONLY)  COMPREHENSIVE  METABOLIC PANEL  URINALYSIS COMPLETEWITH MICROSCOPIC ([place])     RADIOLOGY Right lower lobe pneumonia per radiologist. Leana Gamer, personally  viewed and evaluated these images (plain radiographs) as part of my medical decision making, as well as reviewing the written report by the radiologist.  ____________________________________________   PROCEDURES  Procedure(s) performed: None  Critical Care performed: No  ____________________________________________   INITIAL IMPRESSION / ASSESSMENT AND PLAN / Joseph COURSE  Pertinent labs & imaging results that were available during my care of the patient were reviewed by me and considered in my medical decision making (see chart for details).  Patient was given IV Levaquin while in the emergency room. He was discharged on Levaquin 500 milligrams one daily along with Tussionex twice a day as needed for cough. He is to continue with fluids and also Tylenol or Motrin as needed for fever. ____________________________________________   FINAL CLINICAL IMPRESSION(S) / Joseph DIAGNOSES  Final diagnoses:  Right lower lobe pneumonia      Joseph Hai, PA-C 11/03/15 1910  Eula Listen, MD 11/04/15 1534

## 2015-11-03 NOTE — Discharge Instructions (Signed)

## 2015-11-03 NOTE — ED Notes (Signed)
Pt reports fever and flu-like sxs started Wednesday night. Continued to have fevers since. Pt is clammy. Denies vomiting or diarrhea.  Pt did some Tamiflu the past few days.

## 2015-11-08 ENCOUNTER — Ambulatory Visit: Payer: PRIVATE HEALTH INSURANCE | Admitting: Family Medicine

## 2015-11-21 ENCOUNTER — Ambulatory Visit (INDEPENDENT_AMBULATORY_CARE_PROVIDER_SITE_OTHER)
Admission: RE | Admit: 2015-11-21 | Discharge: 2015-11-21 | Disposition: A | Discharge status: Home / Self Care | Payer: PRIVATE HEALTH INSURANCE | Source: Ambulatory Visit | Attending: Family Medicine | Admitting: Family Medicine

## 2015-11-21 ENCOUNTER — Encounter: Payer: Self-pay | Admitting: Family Medicine

## 2015-11-21 ENCOUNTER — Ambulatory Visit (INDEPENDENT_AMBULATORY_CARE_PROVIDER_SITE_OTHER): Payer: PRIVATE HEALTH INSURANCE | Admitting: Family Medicine

## 2015-11-21 VITALS — BP 124/86 | HR 78 | Temp 97.6°F | Wt 218.0 lb

## 2015-11-21 DIAGNOSIS — J189 Pneumonia, unspecified organism: Secondary | ICD-10-CM | POA: Diagnosis not present

## 2015-11-21 DIAGNOSIS — J181 Lobar pneumonia, unspecified organism: Principal | ICD-10-CM

## 2015-11-21 NOTE — Progress Notes (Signed)
Subjective:   Patient ID: Joseph Church, male    DOB: 02-02-1961, 55 y.o.   MRN: [id number]  Joseph Church is a pleasant 54 y.o. year old male who presents to clinic today with Hospitalization Follow-up  on 11/21/2015  HPI: RLL PNA-  Was seen in ER for cough, fever, chills and body aches on 11/03/15.  Note reviewed.  CXR consistent with RLL PNA.  Given IV Levaquin in ER and discharged home with Po Levaquin 500 mg daily along with Tussionex as needed for cough.  Finished levauqin last Wednesday.  Still coughing and fatigued but this is improving.  Has not had any further fevers since starting abx.   Dg Chest 2 View  11/03/2015  CLINICAL DATA:  Cough with fever for 5 days.  Nonsmoker. EXAM: CHEST  2 VIEW COMPARISON:  Chest radiographs 07/04/2015. FINDINGS: The heart size and mediastinal contours are stable. There is consolidation within the anterior basal segment of the right lower lobe, best seen on the lateral view. The left lung is clear. There is no pleural effusion or pneumothorax. The bones appear unremarkable. Surgical clips are present in the right upper quadrant of the abdomen. IMPRESSION: Right lower lobe pneumonia. Followup PA and lateral chest X-ray is recommended in 3-4 weeks following trial of antibiotic therapy to ensure resolution and exclude underlying malignancy. Electronically Signed   By: Richardean Sale M.D.   On: 11/03/2015 16:32   Current Outpatient Prescriptions on File Prior to Visit  Medication Sig Dispense Refill  . aspirin 81 MG tablet Take 81 mg by mouth daily.    . chlorpheniramine-HYDROcodone (TUSSIONEX PENNKINETIC ER) 10-8 MG/5ML SUER Take 5 mLs by mouth every 12 (twelve) hours as needed for cough. 140 mL 0  . FLUoxetine (PROZAC) 20 MG tablet Take 1 tablet (20 mg total) by mouth daily. 90 tablet 3  . ibuprofen (ADVIL,MOTRIN) 800 MG tablet Take 1 tablet (800 mg total) by mouth every 8 (eight) hours as needed. 90 tablet 0  . metaxalone (SKELAXIN) 800 MG  tablet Take 1 tablet (800 mg total) by mouth 3 (three) times daily. 90 tablet 0  . olmesartan (BENICAR) 20 MG tablet Take 1 tablet (20 mg total) by mouth daily. 90 tablet 3  . omeprazole (PRILOSEC) 20 MG capsule Take 1 capsule (20 mg total) by mouth daily as needed. 90 capsule 3  . ranitidine (ZANTAC) 150 MG capsule Take 1 capsule (150 mg total) by mouth 2 (two) times daily as needed for heartburn. 90 capsule 3  . rosuvastatin (CRESTOR) 40 MG tablet Take 1 tablet (40 mg total) by mouth at bedtime. 90 tablet 1  . zolpidem (AMBIEN) 10 MG tablet Take 1 tablet (10 mg total) by mouth at bedtime. 30 tablet 0   No current facility-administered medications on file prior to visit.    Allergies  Allergen Reactions  . Lisinopril Cough    Past Medical History  Diagnosis Date  . GERD (gastroesophageal reflux disease)   . Hyperlipidemia   . Hypertension     Past Surgical History  Procedure Laterality Date  . Cholecystectomy, laparoscopic  2000  . Tonsillectomy and adenoidectomy  1960's  . Vasectomy  1987  . Vasectomy reversal  1994  . Keratotomy  1995  . Cardiac catheterization      [facility]    Family History  Problem Relation Age of Onset  . Cancer Mother   . Cancer Father     lymphoma  . Hypertension Father   .  Hyperlipidemia Father     Social History   Social History  . Marital Status: Divorced    Spouse Name: N/A  . Number of Children: N/A  . Years of Education: N/A   Occupational History  . Not on file.   Social History Main Topics  . Smoking status: Never Smoker   . Smokeless tobacco: Not on file  . Alcohol Use: 5.0 oz/week    10 drink(s) per week     Comment: daily  . Drug Use: No  . Sexual Activity: Not on file   Other Topics Concern  . Not on file   Social History Narrative   [employer]- travels for [employer]   Divorced 3 times   Has 5 children- two teenagers that live in [place]   [place], Social  History, Family History, Medications, and allergies have been reviewed in [facility], and have been updated if relevant.   Review of Systems  Constitutional: Negative.  Negative for fever.  HENT: Negative.   Eyes: Negative.   Respiratory: Positive for cough. Negative for shortness of breath, wheezing and stridor.   Cardiovascular: Negative.   Gastrointestinal: Negative.   Musculoskeletal: Negative.   Skin: Negative.   Neurological: Negative.   Hematological: Negative.   Psychiatric/Behavioral: Negative.        Objective:    BP 124/86 mmHg  Pulse 78  Temp(Src) 97.6 F (36.4 C) (Oral)  Wt 218 lb (98.884 kg)  SpO2 95%   Physical Exam  Constitutional: He is oriented to person, place, and time. He appears well-developed and well-nourished. No distress.  HENT:  Head: Normocephalic and atraumatic.  Eyes: Conjunctivae are normal.  Cardiovascular: Normal rate and regular rhythm.   Pulmonary/Chest: Breath sounds normal. No respiratory distress. He has no wheezes.  Musculoskeletal: Normal range of motion.  Neurological: He is alert and oriented to person, place, and time. No cranial nerve deficit.  Skin: Skin is dry. He is not diaphoretic.  Psychiatric: He has a normal mood and affect. His behavior is normal. Judgment and thought content normal.  Nursing note and vitals reviewed.         Assessment & Plan:   RLL pneumonia - Plan: DG Chest 2 View No Follow-up on file.

## 2015-11-21 NOTE — Assessment & Plan Note (Signed)
S/p abx. Lung exam reassuring. Follow up CXR today. The patient indicates understanding of these issues and agrees with the plan. Orders Placed This Encounter  Procedures  . DG Chest 2 View

## 2015-11-21 NOTE — Progress Notes (Signed)
Pre visit review using our clinic review tool, if applicable. No additional management support is needed unless otherwise documented below in the visit note. 

## 2015-11-28 ENCOUNTER — Other Ambulatory Visit: Payer: Self-pay | Admitting: Emergency Medicine

## 2015-11-28 ENCOUNTER — Other Ambulatory Visit: Payer: Self-pay | Admitting: Family Medicine

## 2015-11-28 MED ORDER — ZOLPIDEM TARTRATE 10 MG PO TABS: 10.0000 mg | ORAL_TABLET | Freq: Every day | ORAL | Status: DC

## 2015-11-28 NOTE — Telephone Encounter (Signed)
Rx called in to requested pharmacy 

## 2015-11-28 NOTE — Telephone Encounter (Signed)
Last f/u 06/2015-CPE 

## 2015-11-29 ENCOUNTER — Other Ambulatory Visit: Payer: Self-pay | Admitting: Family Medicine

## 2015-12-01 ENCOUNTER — Other Ambulatory Visit: Payer: Self-pay | Admitting: Family Medicine

## 2015-12-09 ENCOUNTER — Other Ambulatory Visit: Payer: Self-pay | Admitting: Family Medicine

## 2015-12-09 ENCOUNTER — Encounter: Payer: Self-pay | Admitting: Family Medicine

## 2015-12-09 MED ORDER — ROSUVASTATIN CALCIUM 40 MG PO TABS: 40.0000 mg | ORAL_TABLET | Freq: Every day | ORAL | Status: DC

## 2015-12-10 ENCOUNTER — Other Ambulatory Visit: Payer: Self-pay | Admitting: Family Medicine

## 2015-12-11 MED ORDER — METAXALONE 800 MG PO TABS: 800.0000 mg | ORAL_TABLET | Freq: Three times a day (TID) | ORAL | Status: DC

## 2015-12-11 NOTE — Telephone Encounter (Signed)
Spoke with patient today.  He was very nice, just concerned that he did not receive his Crestor for 90 days when he called on 10/29/15.  He states there was a discrepancy with the pharmacy. He says he is fine now and at his next appointment with PCP he is going to try to get his medications renewed on the same schedule.

## 2015-12-28 ENCOUNTER — Other Ambulatory Visit: Payer: Self-pay | Admitting: Family Medicine

## 2015-12-30 ENCOUNTER — Other Ambulatory Visit: Payer: Self-pay | Admitting: Family Medicine

## 2015-12-30 MED ORDER — ZOLPIDEM TARTRATE 10 MG PO TABS: 10.0000 mg | ORAL_TABLET | Freq: Every day | ORAL | Status: DC

## 2015-12-30 NOTE — Telephone Encounter (Signed)
Rx called in to requested pharmacy 

## 2015-12-30 NOTE — Telephone Encounter (Signed)
Last labs 06/2015-abnormal. pls advise

## 2015-12-30 NOTE — Telephone Encounter (Signed)
Last f/u 06/2015-CPE 

## 2016-01-07 ENCOUNTER — Ambulatory Visit: Payer: PRIVATE HEALTH INSURANCE | Admitting: Internal Medicine

## 2016-01-23 ENCOUNTER — Ambulatory Visit: Payer: Self-pay | Admitting: Family Medicine

## 2016-01-28 ENCOUNTER — Other Ambulatory Visit: Payer: Self-pay | Admitting: Family Medicine

## 2016-01-28 ENCOUNTER — Encounter: Payer: Self-pay | Admitting: Family Medicine

## 2016-01-28 MED ORDER — ZOLPIDEM TARTRATE 10 MG PO TABS: 10.0000 mg | ORAL_TABLET | Freq: Every day | ORAL | Status: DC

## 2016-01-28 NOTE — Telephone Encounter (Signed)
Rx called in to requested pharmacy 

## 2016-01-28 NOTE — Addendum Note (Signed)
Addended by: Lucille Passy on: 01/28/2016 10:33 AM   Modules accepted: Orders

## 2016-01-28 NOTE — Telephone Encounter (Signed)
Last f/u 06/2015-CPE 

## 2016-03-13 ENCOUNTER — Other Ambulatory Visit: Payer: Self-pay | Admitting: Family Medicine

## 2016-03-13 MED ORDER — IBUPROFEN 800 MG PO TABS: 800.0000 mg | ORAL_TABLET | Freq: Three times a day (TID) | ORAL | Status: DC | PRN

## 2016-03-13 MED ORDER — METAXALONE 800 MG PO TABS: 800.0000 mg | ORAL_TABLET | Freq: Three times a day (TID) | ORAL | Status: DC

## 2016-03-31 ENCOUNTER — Encounter: Payer: Self-pay | Admitting: Family Medicine

## 2016-03-31 MED ORDER — ZOLPIDEM TARTRATE 10 MG PO TABS: 10.0000 mg | ORAL_TABLET | Freq: Every day | ORAL | Status: DC

## 2016-03-31 MED ORDER — OLMESARTAN MEDOXOMIL 20 MG PO TABS: 20.0000 mg | ORAL_TABLET | Freq: Every day | ORAL | Status: DC

## 2016-03-31 NOTE — Telephone Encounter (Signed)
Last f/u 06/2015-CPE 

## 2016-03-31 NOTE — Telephone Encounter (Signed)
Rx called in to requested pharmacy 

## 2016-04-28 ENCOUNTER — Other Ambulatory Visit: Payer: Self-pay | Admitting: Family Medicine

## 2016-05-28 ENCOUNTER — Other Ambulatory Visit: Payer: Self-pay | Admitting: Family Medicine

## 2016-05-28 MED ORDER — ROSUVASTATIN CALCIUM 40 MG PO TABS: 40.0000 mg | ORAL_TABLET | Freq: Every day | ORAL | 0 refills | Status: DC

## 2016-06-01 ENCOUNTER — Other Ambulatory Visit: Payer: Self-pay | Admitting: Family Medicine

## 2016-06-01 NOTE — Telephone Encounter (Signed)
Last f/u 06/2015

## 2016-06-02 NOTE — Telephone Encounter (Signed)
Rx called in to requested pharmacy 

## 2016-06-30 ENCOUNTER — Other Ambulatory Visit: Payer: Self-pay | Admitting: Family Medicine

## 2016-06-30 MED ORDER — ROSUVASTATIN CALCIUM 40 MG PO TABS: 40.0000 mg | ORAL_TABLET | Freq: Every day | ORAL | 0 refills | Status: DC

## 2016-07-09 ENCOUNTER — Other Ambulatory Visit: Payer: Self-pay | Admitting: Family Medicine

## 2016-07-28 ENCOUNTER — Other Ambulatory Visit: Payer: Self-pay | Admitting: Family Medicine

## 2016-07-29 ENCOUNTER — Other Ambulatory Visit: Payer: Self-pay | Admitting: Family Medicine

## 2016-07-29 MED ORDER — ROSUVASTATIN CALCIUM 40 MG PO TABS: 40.0000 mg | ORAL_TABLET | Freq: Every day | ORAL | 0 refills | Status: DC

## 2016-07-29 NOTE — Telephone Encounter (Signed)
Rx called in to requested pharmacy 

## 2016-07-29 NOTE — Telephone Encounter (Signed)
Last f/u 06/2015

## 2016-07-31 ENCOUNTER — Encounter: Payer: Self-pay | Admitting: Family Medicine

## 2016-07-31 ENCOUNTER — Ambulatory Visit (INDEPENDENT_AMBULATORY_CARE_PROVIDER_SITE_OTHER): Payer: PRIVATE HEALTH INSURANCE | Admitting: Family Medicine

## 2016-07-31 ENCOUNTER — Other Ambulatory Visit: Payer: Self-pay | Admitting: Family Medicine

## 2016-07-31 ENCOUNTER — Ambulatory Visit (INDEPENDENT_AMBULATORY_CARE_PROVIDER_SITE_OTHER)
Admission: RE | Admit: 2016-07-31 | Discharge: 2016-07-31 | Disposition: A | Discharge status: Home / Self Care | Payer: Federal, State, Local not specified - PPO | Source: Ambulatory Visit | Attending: Family Medicine | Admitting: Family Medicine

## 2016-07-31 ENCOUNTER — Ambulatory Visit: Payer: Self-pay

## 2016-07-31 ENCOUNTER — Other Ambulatory Visit (INDEPENDENT_AMBULATORY_CARE_PROVIDER_SITE_OTHER): Payer: Federal, State, Local not specified - PPO

## 2016-07-31 VITALS — BP 132/84 | HR 83 | Ht 73.0 in | Wt 227.0 lb

## 2016-07-31 DIAGNOSIS — M25512 Pain in left shoulder: Secondary | ICD-10-CM

## 2016-07-31 DIAGNOSIS — C4441 Basal cell carcinoma of skin of scalp and neck: Secondary | ICD-10-CM | POA: Diagnosis not present

## 2016-07-31 DIAGNOSIS — E785 Hyperlipidemia, unspecified: Secondary | ICD-10-CM

## 2016-07-31 DIAGNOSIS — Z0000 Encounter for general adult medical examination without abnormal findings: Secondary | ICD-10-CM | POA: Diagnosis not present

## 2016-07-31 DIAGNOSIS — M75112 Incomplete rotator cuff tear or rupture of left shoulder, not specified as traumatic: Secondary | ICD-10-CM | POA: Diagnosis not present

## 2016-07-31 DIAGNOSIS — G8929 Other chronic pain: Secondary | ICD-10-CM

## 2016-07-31 DIAGNOSIS — M75102 Unspecified rotator cuff tear or rupture of left shoulder, not specified as traumatic: Secondary | ICD-10-CM | POA: Insufficient documentation

## 2016-07-31 LAB — LIPID PANEL
CHOL/HDL RATIO: 4
Cholesterol: 154 mg/dL (ref 0–200)
HDL: 41.8 mg/dL (ref >39.00)
LDL Cholesterol: 83 mg/dL (ref 0–99)
NONHDL: 111.7
Triglycerides: 145 mg/dL (ref 0.0–149.0)
VLDL: 29 mg/dL (ref 0.0–40.0)

## 2016-07-31 LAB — COMPREHENSIVE METABOLIC PANEL
ALT: 31 U/L (ref 0–53)
AST: 25 U/L (ref 0–37)
Albumin: 4.1 g/dL (ref 3.5–5.2)
Alkaline Phosphatase: 74 U/L (ref 39–117)
BILIRUBIN TOTAL: 0.5 mg/dL (ref 0.2–1.2)
BUN: 16 mg/dL (ref 6–23)
CO2: 29 meq/L (ref 19–32)
CREATININE: 0.99 mg/dL (ref 0.40–1.50)
Calcium: 9.4 mg/dL (ref 8.4–10.5)
Chloride: 101 mEq/L (ref 96–112)
GFR: 83.1 mL/min (ref >60.00)
GLUCOSE: 111 mg/dL — AB (ref 70–99)
Potassium: 4.7 mEq/L (ref 3.5–5.1)
SODIUM: 139 meq/L (ref 135–145)
Total Protein: 6.3 g/dL (ref 6.0–8.3)

## 2016-07-31 LAB — CBC WITH DIFFERENTIAL/PLATELET
BASOS ABS: 0 10*3/uL (ref 0.0–0.1)
Basophils Relative: 0.5 % (ref 0.0–3.0)
EOS ABS: 0.3 10*3/uL (ref 0.0–0.7)
Eosinophils Relative: 4.1 % (ref 0.0–5.0)
HCT: 42.9 % (ref 39.0–52.0)
Hemoglobin: 14.8 g/dL (ref 13.0–17.0)
LYMPHS ABS: 1.8 10*3/uL (ref 0.7–4.0)
LYMPHS PCT: 28.7 % (ref 12.0–46.0)
MCHC: 34.4 g/dL (ref 30.0–36.0)
MCV: 84.5 fl (ref 78.0–100.0)
Monocytes Absolute: 0.6 10*3/uL (ref 0.1–1.0)
Monocytes Relative: 10.2 % (ref 3.0–12.0)
NEUTROS ABS: 3.5 10*3/uL (ref 1.4–7.7)
NEUTROS PCT: 56.5 % (ref 43.0–77.0)
PLATELETS: 218 10*3/uL (ref 150.0–400.0)
RBC: 5.08 Mil/uL (ref 4.22–5.81)
RDW: 15.2 % (ref 11.5–15.5)
WBC: 6.2 10*3/uL (ref 4.0–10.5)

## 2016-07-31 LAB — PSA: PSA: 0.56 ng/mL (ref 0.10–4.00)

## 2016-07-31 MED ORDER — MELOXICAM 15 MG PO TABS: 15.0000 mg | ORAL_TABLET | Freq: Every day | ORAL | 0 refills | Status: DC

## 2016-07-31 NOTE — Assessment & Plan Note (Signed)
On ultrasound it seems to be more partial tear but patient is having worsening symptoms with weakness at this time. Patient has had a long-standing aspect of the shoulder and has done all conservative therapy including formal physical therapy over the course last 2 years. At this point I do feel that advance imaging is warranted. X-rays were ordered, independently visualized by me today showing no bony normality. I do believe the patient would likely have a large rotator cuff tear with the potential for a labral tear. MR arthrogram is ordered today for further evaluation. After findings we'll discuss if we can continue conservative therapy or surgical intervention will be necessary. Discussion for meloxicam given for pain relief.

## 2016-07-31 NOTE — Patient Instructions (Addendum)
Good to see you  Sorry for the bad news  Lets get xray today  MR-arthrogram ordered Meloxicam daily for 10 days then as needed We will talk.

## 2016-07-31 NOTE — Progress Notes (Signed)
[facility] [address] [place], [place] [address] Phone: [phone] Subjective:    I'm seeing this patient by the request  of:  Arnette Norris, MD   CC: Shoulder pain left  Right elbow pain  [id number]  Joseph Church is a 55 y.o. male coming in with complaint of left shoulder pain. Patient was found to have some impingement syndromes Over the course of time. Patient had responded to injections previously but unfortunately will worsen we has had it significantly with worse pain. Patient states that also having some weakness. This is new. Waking him up at night. Rates the severity of pain as 9 out of 10. Patient is concerned that something is significant different than the last time we saw him 10 months ago.   Past Medical History:  Diagnosis Date  . GERD (gastroesophageal reflux disease)   . Hyperlipidemia   . Hypertension    Past Surgical History:  Procedure Laterality Date  . [facility]  . CHOLECYSTECTOMY, LAPAROSCOPIC  2000  . keratotomy  1995  . TONSILLECTOMY AND ADENOIDECTOMY  1960's  . VASECTOMY  1987  . [place]   Social History  Substance Use Topics  . Smoking status: Never Smoker  . Smokeless tobacco: Not on file  . Alcohol use 5.0 oz/week    10 drink(s) per week     Comment: daily   Allergies  Allergen Reactions  . Lisinopril Cough   Family History  Problem Relation Age of Onset  . Cancer Mother   . Cancer Father     lymphoma  . Hypertension Father   . Hyperlipidemia Father   . Heart disease Father 29    CABG x 4        Past medical history, social, surgical and family history all reviewed in electronic medical record.   Review of Systems: No headache, visual changes, nausea, vomiting, diarrhea, constipation, dizziness, abdominal pain, skin rash, fevers, chills, night sweats, weight loss, swollen lymph nodes, body aches, joint  swelling, muscle aches, chest pain, shortness of breath, mood changes.   Objective  Blood pressure 132/84, pulse 83, height 6\' 1"  (1.854 m), weight 227 lb (103 kg), SpO2 97 %.  General: No apparent distress alert and oriented x3 mood and affect normal, dressed appropriately.  Systems examined below as of 07/31/16 General: NAD A&O x3 mood, affect normal  HEENT: Pupils equal, extraocular movements intact no nystagmus Respiratory: not short of breath at rest or with speaking Cardiovascular: No lower extremity edema, non tender Skin: Warm dry intact with no signs of infection or rash on extremities or on axial skeleton. Abdomen: Soft nontender, no masses Neuro: Cranial nerves  intact, neurovascularly intact in all extremities with 2+ DTRs and 2+ pulses. Lymph: No lymphadenopathy appreciated today  Gait normal with good balance and coordination.  MSK: Non tender with full range of motion and good stability and symmetric strength and tone of , elbows, wrist,  knee hips and ankles bilaterally.    Shoulder: left Inspection reveals no abnormalities, atrophy or asymmetry. Diffuse tenderness of the shoulder. ROM is full in all planes passively. Rotator cuff strength is 3+ out of 5 compared to the contralateral side signs of impingement with positive Neer and Hawkin's tests, but negative empty can sign. Speeds and Yergason's tests normal. Positive labral pathology with O'Brien Normal scapular function observed. Positive painful arc. No apprehension sign Contralateral shoulder unremarkable  MSK US  performed of: left This study was ordered, performed, and interpreted by Charlann Boxer D.O.  Shoulder:   Supraspinatus:  Patient does have a large articular side tear of the supraspinatus. Patient does have some mild retraction. Seems to be more partial and not a full-thickness tear though. Approximately 50-60% of the tendon is involved Infraspinatus:  Appears normal on long and transverse views.  Significant increase in Doppler flow Subscapularis:  Appears normal on long and transverse views. Positive bursa AC joint:  Mild arthritic changes Glenohumeral Joint:  Trace effusion noted. Glenoid Labrum:  Intact without visualized tears. Biceps Tendon:  Appears normal on long and transverse views, no fraying of tendon, tendon located in intertubercular groove, no subluxation with shoulder internal or external rotation.  Impression: Partial rotator cuff tear     Impression and Recommendations:     This case required medical decision making of moderate complexity.

## 2016-08-12 ENCOUNTER — Ambulatory Visit (INDEPENDENT_AMBULATORY_CARE_PROVIDER_SITE_OTHER): Payer: Federal, State, Local not specified - PPO | Admitting: Family Medicine

## 2016-08-12 ENCOUNTER — Encounter: Payer: Self-pay | Admitting: Family Medicine

## 2016-08-12 VITALS — BP 106/82 | HR 83 | Temp 97.5°F | Ht 73.0 in | Wt 234.0 lb

## 2016-08-12 DIAGNOSIS — E785 Hyperlipidemia, unspecified: Secondary | ICD-10-CM

## 2016-08-12 DIAGNOSIS — Z23 Encounter for immunization: Secondary | ICD-10-CM | POA: Diagnosis not present

## 2016-08-12 DIAGNOSIS — G4700 Insomnia, unspecified: Secondary | ICD-10-CM | POA: Diagnosis not present

## 2016-08-12 DIAGNOSIS — F32A Depression, unspecified: Secondary | ICD-10-CM | POA: Insufficient documentation

## 2016-08-12 DIAGNOSIS — K219 Gastro-esophageal reflux disease without esophagitis: Secondary | ICD-10-CM

## 2016-08-12 DIAGNOSIS — Z0000 Encounter for general adult medical examination without abnormal findings: Secondary | ICD-10-CM

## 2016-08-12 DIAGNOSIS — F329 Major depressive disorder, single episode, unspecified: Secondary | ICD-10-CM

## 2016-08-12 DIAGNOSIS — I10 Essential (primary) hypertension: Secondary | ICD-10-CM | POA: Diagnosis not present

## 2016-08-12 LAB — GLUCOSE, POCT (MANUAL RESULT ENTRY): POC GLUCOSE: 108 mg/dL — AB (ref 70–99)

## 2016-08-12 MED ORDER — ZOLPIDEM TARTRATE 10 MG PO TABS: ORAL_TABLET | ORAL | 1 refills | Status: DC

## 2016-08-12 MED ORDER — ROSUVASTATIN CALCIUM 40 MG PO TABS: 40.0000 mg | ORAL_TABLET | Freq: Every day | ORAL | 3 refills | Status: DC

## 2016-08-12 MED ORDER — RANITIDINE HCL 150 MG PO CAPS: ORAL_CAPSULE | ORAL | 3 refills | Status: DC

## 2016-08-12 MED ORDER — OMEPRAZOLE 20 MG PO CPDR: 20.0000 mg | DELAYED_RELEASE_CAPSULE | Freq: Every day | ORAL | 3 refills | Status: DC | PRN

## 2016-08-12 MED ORDER — OLMESARTAN MEDOXOMIL 20 MG PO TABS: ORAL_TABLET | ORAL | 3 refills | Status: DC

## 2016-08-12 MED ORDER — FLUOXETINE HCL 20 MG PO TABS: 20.0000 mg | ORAL_TABLET | Freq: Every day | ORAL | 3 refills | Status: DC

## 2016-08-12 NOTE — Progress Notes (Signed)
Subjective:    Patient ID: Joseph Church, male    DOB: [birth year], 55 y.o.   MRN: [id number]  HPI  Very pleasant 11 male here for CPXand follow up chronic medical conditions.   UTD colonoscopy- 2012- cleared for 10 years.  Now working at [employer] in [place].  Much happier there.  Strong FH of CAD. He did have a  neg stress test a few years ago (Dr. Fletcher Anon).   HTN- on Benicar 20 mg daily.  BP has been well controlled on this. No CP, SOB or LE edema.   Lab Results  Component Value Date   CREATININE 0.99 07/31/2016     HLD- on Crestor 40 mg qhs.    Denies myalgias. Lab Results  Component Value Date   CHOL 154 07/31/2016   HDL 41.80 07/31/2016   LDLCALC 83 07/31/2016   TRIG 145.0 07/31/2016   CHOLHDL 4 07/31/2016    Lab Results  Component Value Date   PSA 0.56 07/31/2016   PSA 0.69 07/11/2015   PSA 0.61 07/03/2014   Insomnia- still takes akes Ambien several nights per week.  Does not take on weekends or nights that he does not have to work.  Could not tolerate antihistamines or lunesta. More stressors at home with his mother in [place] issues.  GERD- Symptoms controlled with Zantac and Omeprazole.  Non smoker.  ED- takes daily cialis without side effects.  He is pleased with results.     Depression- stop taking Prozac a few weeks ago.  He wants to see how he feels off of it.   Patient Active Problem List   Diagnosis Date Noted  . Visit for well man health check 07/31/2016  . Benign colon polyp 05/13/2015  . Erectile dysfunction 07/07/2013  . GERD (gastroesophageal reflux disease)   . Hyperlipidemia   . Hypertension    Past Medical History:  Diagnosis Date  . GERD (gastroesophageal reflux disease)   . Hyperlipidemia   . Hypertension    Past Surgical History:  Procedure Laterality Date  . [facility];[place]  . CHOLECYSTECTOMY, LAPAROSCOPIC  2000  . keratotomy  1995  . TONSILLECTOMY AND  ADENOIDECTOMY  1960's  . VASECTOMY  1987  . [place]   Social History  Substance Use Topics  . Smoking status: Never Smoker  . Smokeless tobacco: Not on file  . Alcohol use 5.0 oz/week    10 drink(s) per week     Comment: daily   Family History  Problem Relation Age of Onset  . Cancer Mother   . Cancer Father     lymphoma  . Hypertension Father   . Hyperlipidemia Father   . Heart disease Father 38    CABG x 4   Allergies  Allergen Reactions  . Lisinopril Cough   Current Outpatient Prescriptions on File Prior to Visit  Medication Sig Dispense Refill  . aspirin 81 MG tablet Take 81 mg by mouth daily.    . chlorpheniramine-HYDROcodone (TUSSIONEX PENNKINETIC ER) 10-8 MG/5ML SUER Take 5 mLs by mouth every 12 (twelve) hours as needed for cough. 140 mL 0  . FLUoxetine (PROZAC) 20 MG tablet Take 1 tablet (20 mg total) by mouth daily. 90 tablet 3  . ibuprofen (ADVIL,MOTRIN) 800 MG tablet Take 1 tablet (800 mg total) by mouth every 8 (eight) hours as needed. 90 tablet 2  . meloxicam (MOBIC) 15 MG tablet Take 1 tablet (15 mg total) by  mouth daily. 30 tablet 0  . metaxalone (SKELAXIN) 800 MG tablet Take 1 tablet (800 mg total) by mouth 3 (three) times daily. 90 tablet 2  . olmesartan (BENICAR) 20 MG tablet TAKE 1 TABLET BY MOUTH DAILY. COMPLETE PHYSICAL EXAM REQUIRED FOR ADDITIONAL REFILLS 30 tablet 0  . omeprazole (PRILOSEC) 20 MG capsule Take 1 capsule (20 mg total) by mouth daily as needed. 90 capsule 3  . ranitidine (ZANTAC) 150 MG capsule Take 1 capsule (150 mg total) by mouth 2 (two) times daily as needed for heartburn. 90 capsule 3  . rosuvastatin (CRESTOR) 40 MG tablet Take 1 tablet (40 mg total) by mouth at bedtime. OFFICE VISIT WITH LABS REQUIRED FOR ADDITIONAL REFILLS 30 tablet 0  . zolpidem (AMBIEN) 10 MG tablet TKAE 1 TABLET BY MOUTH AT BEDTIME 30 tablet 0   No current facility-administered medications on file prior to visit.    The PMH, PSH, Social History,  Family History, Medications, and allergies have been reviewed in [facility], and have been updated if relevant.  Review of Systems  Constitutional: Negative.   HENT: Negative.   Eyes: Negative.   Respiratory: Negative.   Cardiovascular: Negative for chest pain and palpitations.  Gastrointestinal: Negative.   Endocrine: Negative.   Genitourinary: Negative.  Negative for difficulty urinating, dysuria, frequency and urgency.  Musculoskeletal: Negative for neck pain.  Allergic/Immunologic: Negative.   Neurological: Negative.   Hematological: Negative.   Psychiatric/Behavioral: Negative.   All other systems reviewed and are negative.     Objective:   Physical Exam There were no vitals taken for this visit. General:  pleasant male in no acute distress Eyes:  PERRL Ears:  External ear exam shows no significant lesions or deformities.  TMs normal bilaterally Hearing is grossly normal bilaterally. Nose:  External nasal examination shows no deformity or inflammation. Nasal mucosa are pink and moist without lesions or exudates. Mouth:  Oral mucosa and oropharynx without lesions or exudates.  Teeth in good repair. Neck:  no carotid bruit or thyromegaly no cervical or supraclavicular lymphadenopathy  Lungs:  Normal respiratory effort, chest expands symmetrically. Lungs are clear to auscultation, no crackles or wheezes. Heart:  Normal rate and regular rhythm. S1 and S2 normal without gallop, murmur, click, rub or other extra sounds. Abdomen:  Bowel sounds positive,abdomen soft and non-tender without masses, organomegaly or hernias noted. Pulses:  R and L posterior tibial pulses are full and equal bilaterally  Extremities:  no edema  Psych:  Good eye contact, not anxious or depressed appearing    Assessment & Plan:

## 2016-08-12 NOTE — Assessment & Plan Note (Signed)
Reviewed preventive care protocols, scheduled due services, and updated immunizations Discussed nutrition, exercise, diet, and healthy lifestyle.  

## 2016-08-12 NOTE — Progress Notes (Signed)
Pre visit review using our clinic review tool, if applicable. No additional management support is needed unless otherwise documented below in the visit note. 

## 2016-08-12 NOTE — Addendum Note (Signed)
Addended by: Pilar Grammes on: 08/12/2016 08:15 AM   Modules accepted: Orders

## 2016-08-12 NOTE — Patient Instructions (Signed)
Great to see you. Happy Birthday and Happy Holidays!

## 2016-09-01 ENCOUNTER — Other Ambulatory Visit: Payer: Federal, State, Local not specified - PPO

## 2016-09-07 ENCOUNTER — Telehealth: Payer: Federal, State, Local not specified - PPO | Admitting: Family

## 2016-09-07 DIAGNOSIS — B9689 Other specified bacterial agents as the cause of diseases classified elsewhere: Secondary | ICD-10-CM

## 2016-09-07 DIAGNOSIS — J028 Acute pharyngitis due to other specified organisms: Secondary | ICD-10-CM

## 2016-09-07 MED ORDER — BENZONATATE 100 MG PO CAPS: 100.0000 mg | ORAL_CAPSULE | Freq: Three times a day (TID) | ORAL | 0 refills | Status: DC | PRN

## 2016-09-07 MED ORDER — AZITHROMYCIN 250 MG PO TABS: ORAL_TABLET | ORAL | 0 refills | Status: DC

## 2016-09-07 MED ORDER — PREDNISONE 5 MG PO TABS: 5.0000 mg | ORAL_TABLET | ORAL | 0 refills | Status: DC

## 2016-09-07 NOTE — Progress Notes (Signed)
We are sorry that you are not feeling well.  Here is how we plan to help!  Based on what you have shared with me it looks like you have upper respiratory tract inflammation that has resulted in a significant cough.  Inflammation and infection in the upper respiratory tract is commonly called bronchitis and has four common causes:  Allergies, Viral Infections, Acid Reflux and Bacterial Infections.  Allergies, viruses and acid reflux are treated by controlling symptoms or eliminating the cause. An example might be a cough caused by taking certain blood pressure medications. You stop the cough by changing the medication. Another example might be a cough caused by acid reflux. Controlling the reflux helps control the cough.  Based on your presentation I believe you most likely have A cough due to bacteria.  When patients have a fever and a productive cough with a change in color or increased sputum production, we are concerned about bacterial bronchitis.  If left untreated it can progress to pneumonia.  If your symptoms do not improve with your treatment plan it is important that you contact your provider.   I have prescribed Azithromyin 250 mg: two tables now and then one tablet daily for 4 additonal days    In addition you may use A non-prescription cough medication called Mucinex DM: take 2 tablets every 12 hours. and A prescription cough medication called Tessalon Perles 100mg. You may take 1-2 capsules every 8 hours as needed for your cough.  Sterapred 5 mg dosepak  USE OF BRONCHODILATOR ("RESCUE") INHALERS: There is a risk from using your bronchodilator too frequently.  The risk is that over-reliance on a medication which only relaxes the muscles surrounding the breathing tubes can reduce the effectiveness of medications prescribed to reduce swelling and congestion of the tubes themselves.  Although you feel brief relief from the bronchodilator inhaler, your asthma may actually be worsening with the  tubes becoming more swollen and filled with mucus.  This can delay other crucial treatments, such as oral steroid medications. If you need to use a bronchodilator inhaler daily, several times per day, you should discuss this with your provider.  There are probably better treatments that could be used to keep your asthma under control.     HOME CARE . Only take medications as instructed by your medical team. . Complete the entire course of an antibiotic. . Drink plenty of fluids and get plenty of rest. . Avoid close contacts especially the very young and the elderly . Cover your mouth if you cough or cough into your sleeve. . Always remember to wash your hands . A steam or ultrasonic humidifier can help congestion.   GET HELP RIGHT AWAY IF: . You develop worsening fever. . You become short of breath . You cough up blood. . Your symptoms persist after you have completed your treatment plan MAKE SURE YOU   Understand these instructions.  Will watch your condition.  Will get help right away if you are not doing well or get worse.  Your e-visit answers were reviewed by a board certified advanced clinical practitioner to complete your personal care plan.  Depending on the condition, your plan could have included both over the counter or prescription medications. If there is a problem please reply  once you have received a response from your provider. Your safety is important to us.  If you have drug allergies check your prescription carefully.    You can use MyChart to ask questions about today's   visit, request a non-urgent call back, or ask for a work or school excuse for 24 hours related to this e-Visit. If it has been greater than 24 hours you will need to follow up with your provider, or enter a new e-Visit to address those concerns. You will get an e-mail in the next two days asking about your experience.  I hope that your e-visit has been valuable and will speed your recovery. Thank you  for using e-visits.   

## 2016-09-16 ENCOUNTER — Telehealth: Payer: Self-pay

## 2016-09-16 NOTE — Telephone Encounter (Signed)
Please call patient back at [phone].

## 2016-09-16 NOTE — Telephone Encounter (Signed)
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 No fever. Pt had pneumonia last year and wants to have CXR done 09/16/16 in the afternoon at [facility]. Pt does not want to schedule appt at this point. Pt request cb.

## 2016-09-16 NOTE — Telephone Encounter (Signed)
Spoke to pt. He declined an office visit. Michela Pitcher he will just deal with the cough

## 2016-09-16 NOTE — Telephone Encounter (Signed)
I really think he should see someone in person before ordering any tests. It is not reasonable to continue to treat without a hands on evaluation

## 2016-09-17 ENCOUNTER — Other Ambulatory Visit: Payer: Self-pay | Admitting: Family Medicine

## 2016-09-17 NOTE — Telephone Encounter (Signed)
Refill done.  

## 2016-10-07 ENCOUNTER — Other Ambulatory Visit: Payer: Federal, State, Local not specified - PPO

## 2016-10-07 ENCOUNTER — Inpatient Hospital Stay: Admission: RE | Admit: 2016-10-07 | Payer: Federal, State, Local not specified - PPO | Source: Ambulatory Visit

## 2016-10-24 ENCOUNTER — Encounter: Payer: Self-pay | Admitting: Family Medicine

## 2016-10-26 MED ORDER — OMEPRAZOLE 20 MG PO CPDR: 20.0000 mg | DELAYED_RELEASE_CAPSULE | Freq: Every day | ORAL | 2 refills | Status: DC | PRN

## 2016-10-28 ENCOUNTER — Ambulatory Visit
Admission: RE | Admit: 2016-10-28 | Discharge: 2016-10-28 | Disposition: A | Discharge status: Home / Self Care | Payer: Federal, State, Local not specified - PPO | Source: Ambulatory Visit | Attending: Family Medicine | Admitting: Family Medicine

## 2016-10-28 DIAGNOSIS — G8929 Other chronic pain: Secondary | ICD-10-CM

## 2016-10-28 DIAGNOSIS — M25512 Pain in left shoulder: Principal | ICD-10-CM

## 2016-10-28 DIAGNOSIS — S43432A Superior glenoid labrum lesion of left shoulder, initial encounter: Secondary | ICD-10-CM | POA: Diagnosis not present

## 2016-10-28 MED ORDER — IOPAMIDOL (ISOVUE-M 200) INJECTION 41%
12.0000 mL | Freq: Once | INTRAMUSCULAR | Status: AC
  Administered 2016-10-28: 12 mL via INTRA_ARTICULAR

## 2016-10-29 ENCOUNTER — Encounter: Payer: Self-pay | Admitting: Family Medicine

## 2016-10-29 DIAGNOSIS — M75112 Incomplete rotator cuff tear or rupture of left shoulder, not specified as traumatic: Secondary | ICD-10-CM

## 2016-11-05 ENCOUNTER — Other Ambulatory Visit: Payer: Self-pay | Admitting: Family Medicine

## 2016-11-05 NOTE — Telephone Encounter (Signed)
Last f/u 07/2016-CPE

## 2016-11-05 NOTE — Telephone Encounter (Signed)
Rx called in to requested pharmacy 

## 2016-11-09 DIAGNOSIS — S43432A Superior glenoid labrum lesion of left shoulder, initial encounter: Secondary | ICD-10-CM | POA: Diagnosis not present

## 2016-11-23 ENCOUNTER — Encounter: Payer: Self-pay | Admitting: Family Medicine

## 2016-11-23 MED ORDER — FLUOXETINE HCL 20 MG PO TABS: 20.0000 mg | ORAL_TABLET | Freq: Every day | ORAL | 2 refills | Status: DC

## 2017-01-13 DIAGNOSIS — C4441 Basal cell carcinoma of skin of scalp and neck: Secondary | ICD-10-CM | POA: Diagnosis not present

## 2017-01-20 ENCOUNTER — Encounter: Payer: Self-pay | Admitting: Family Medicine

## 2017-01-20 ENCOUNTER — Other Ambulatory Visit: Payer: Self-pay | Admitting: Family Medicine

## 2017-01-20 MED ORDER — SCOPOLAMINE 1 MG/3DAYS TD PT72
1.0000 | MEDICATED_PATCH | TRANSDERMAL | 12 refills | Status: DC
Start: 2017-01-20 — End: 2018-03-21

## 2017-02-15 ENCOUNTER — Ambulatory Visit
Admission: EM | Admit: 2017-02-15 | Discharge: 2017-02-15 | Disposition: A | Discharge status: Home / Self Care | Payer: Federal, State, Local not specified - PPO | Attending: Family Medicine | Admitting: Family Medicine

## 2017-02-15 ENCOUNTER — Encounter: Payer: Self-pay | Admitting: Emergency Medicine

## 2017-02-15 DIAGNOSIS — L255 Unspecified contact dermatitis due to plants, except food: Secondary | ICD-10-CM | POA: Diagnosis not present

## 2017-02-15 MED ORDER — PREDNISONE 10 MG PO TABS: ORAL_TABLET | ORAL | 0 refills | Status: DC

## 2017-02-15 MED ORDER — METHYLPREDNISOLONE SODIUM SUCC 125 MG IJ SOLR
80.0000 mg | Freq: Once | INTRAMUSCULAR | Status: AC
  Administered 2017-02-15: 80 mg via INTRAMUSCULAR

## 2017-02-15 NOTE — ED Triage Notes (Signed)
Patient states he has itchy rash on both arms

## 2017-02-15 NOTE — Discharge Instructions (Signed)
Take medication as prescribed. Drink plenty of fluids.  ° °Follow up with your primary care physician this week as needed. Return to Urgent care for new or worsening concerns.  ° °

## 2017-02-15 NOTE — ED Provider Notes (Signed)
[facility] ____________________________________________  Time seen: Approximately 2:10 PM  I have reviewed the triage vital signs and the nursing notes.   HISTORY  Chief Complaint Rash   HPI Joseph Church is a 56 y.o. male  presenting for evaluation of bilateral forearm itchy rash is in present for the last 2 days. Patient states that he was outside working and pulling weeds and please is exposed to poison oak or poison ivy. Patient reports this is a frequent occurrence for him and often over-the-counter medications do not work for the same complaint. Patient reports otherwise feels well. Denies any lip, tongue, oral or facial involvement. Denies fevers. Reports continues to eat and drink well. Has not tried anything over-the-counter. Reports rash continues to spread.  Denies chest pain, shortness of breath, abdominal pain.. Denies recent sickness. Denies recent antibiotic use.    Past Medical History:  Diagnosis Date  . GERD (gastroesophageal reflux disease)   . Hyperlipidemia   . Hypertension     Patient Active Problem List   Diagnosis Date Noted  . Insomnia 08/12/2016  . Depression 08/12/2016  . Visit for well man health check 07/31/2016  . Benign colon polyp 05/13/2015  . Erectile dysfunction 07/07/2013  . GERD (gastroesophageal reflux disease)   . Hyperlipidemia   . Hypertension     Past Surgical History:  Procedure Laterality Date  . [place]  . CHOLECYSTECTOMY, LAPAROSCOPIC  2000  . keratotomy  1995  . TONSILLECTOMY AND ADENOIDECTOMY  1960's  . VASECTOMY  1987  . VASECTOMY REVERSAL  1994     No current facility-administered medications for this encounter.   Current Outpatient Prescriptions:  .  aspirin 81 MG tablet, Take 81 mg by mouth daily., Disp: , Rfl:  .  FLUoxetine (PROZAC) 20 MG tablet, Take 1 tablet (20 mg total) by mouth daily., Disp: 90 tablet, Rfl: 2 .   ibuprofen (ADVIL,MOTRIN) 800 MG tablet, Take 1 tablet (800 mg total) by mouth every 8 (eight) hours as needed., Disp: 90 tablet, Rfl: 2 .  olmesartan (BENICAR) 20 MG tablet, TAKE 1 TABLET BY MOUTH DAILY., Disp: 90 tablet, Rfl: 3 .  rosuvastatin (CRESTOR) 40 MG tablet, Take 1 tablet (40 mg total) by mouth at bedtime., Disp: 90 tablet, Rfl: 3 .  azithromycin (ZITHROMAX) 250 MG tablet, Take 2 tabs now then 1 daily times 4 days., Disp: 6 tablet, Rfl: 0 .  benzonatate (TESSALON PERLES) 100 MG capsule, Take 1-2 capsules (100-200 mg total) by mouth every 8 (eight) hours as needed for cough., Disp: 30 capsule, Rfl: 0 .  meloxicam (MOBIC) 15 MG tablet, TAKE 1 TABLET (15 MG TOTAL) BY MOUTH DAILY., Disp: 30 tablet, Rfl: 3 .  metaxalone (SKELAXIN) 800 MG tablet, Take 1 tablet (800 mg total) by mouth 3 (three) times daily., Disp: 90 tablet, Rfl: 2 .  omeprazole (PRILOSEC) 20 MG capsule, Take 1 capsule (20 mg total) by mouth daily as needed., Disp: 90 capsule, Rfl: 2 .  predniSONE (DELTASONE) 10 MG tablet, Take 60mg  orally day one, then 55 mg orally day two, then 50 mg orally day three, then taper by 5 mg daily over 12 days until complete., Disp: 35 tablet, Rfl: 0 .  predniSONE (DELTASONE) 5 MG tablet, Take 1 tablet (5 mg total) by mouth as directed. Sterapred dose pack per pharmacy, Disp: 21 tablet, Rfl: 0 .  ranitidine (ZANTAC) 150 MG capsule, Take 1 capsule (150 mg total) by mouth 2 (two)  times daily as needed for heartburn., Disp: 90 capsule, Rfl: 3 .  scopolamine (TRANSDERM-SCOP, 1.5 MG,) 1 MG/3DAYS, Place 1 patch (1.5 mg total) onto the skin every 3 (three) days., Disp: 10 patch, Rfl: 12 .  zolpidem (AMBIEN) 10 MG tablet, TAKE 1 TABLET BY MOUTH AT BEDTIME, Disp: 30 tablet, Rfl: 1  Allergies Lisinopril  Family History  Problem Relation Age of Onset  . Cancer Mother   . Cancer Father        lymphoma  . Hypertension Father   . Hyperlipidemia Father   . Heart disease Father 18       CABG x 4     Social History Social History  Substance Use Topics  . Smoking status: Never Smoker  . Smokeless tobacco: Never Used  . Alcohol use 5.0 oz/week    10 Standard drinks or equivalent per week     Comment: daily    Review of Systems Constitutional: No fever/chills Cardiovascular: Denies chest pain. Respiratory: Denies shortness of breath. Skin: Positive for rash ____________________________________________   PHYSICAL EXAM:  VITAL SIGNS: ED Triage Vitals  Enc Vitals Group     BP 02/15/17 1238 127/85     Pulse Rate 02/15/17 1238 88     Resp 02/15/17 1238 18     Temp 02/15/17 1238 98.1 F (36.7 C)     Temp Source 02/15/17 1238 Oral     SpO2 02/15/17 1238 98 %     Weight 02/15/17 1235 230 lb (104.3 kg)     Height 02/15/17 1235 6\' 1"  (1.854 m)     Head Circumference --      Peak Flow --      Pain Score 02/15/17 1235 0     Pain Loc --      Pain Edu? --      Excl. in [place]? --     Constitutional: Alert and oriented. Well appearing and in no acute distress. Cardiovascular: Normal rate, regular rhythm. Grossly normal heart sounds.  Good peripheral circulation. Respiratory: Normal respiratory effort without tachypnea nor retractions. Breath sounds are clear and equal bilaterally. No wheezes, rales, rhonchi. Neurologic:  Normal speech and language.Speech is normal. No gait instability.  Skin:  Skin is warm, dry. Except: Bilateral forearms with scattered and clustered areas of vesicular. Reticulocyte rash, no surrounding erythema, no drainage, nontender. Psychiatric: Mood and affect are normal. Speech and behavior are normal. Patient exhibits appropriate insight and judgment   ___________________________________________   LABS (all labs ordered are listed, but only abnormal results are displayed)  Labs Reviewed - No data to display  PROCEDURES Procedures    INITIAL IMPRESSION / ASSESSMENT AND PLAN / ED COURSE  Pertinent labs & imaging results that were available during  my care of the patient were reviewed by me and considered in my medical decision making (see chart for details).  Well-appearing patient. No acute distress. Suspect poison ivy or poison oak contact dermatitis. 80 mg IM solumedrol given once in urgent care. Will treat with prednisone taper at home as needed. Avoid scratching. Discussed wound care and supportive care.Discussed indication, risks and benefits of medications with patient.  Discussed follow up with Primary care physician this week. Discussed follow up and return parameters including no resolution or any worsening concerns. Patient verbalized understanding and agreed to plan.   ____________________________________________   FINAL CLINICAL IMPRESSION(S) / ED DIAGNOSES  Final diagnoses:  Contact dermatitis due to plant     Discharge Medication List as of 02/15/2017  1:22 PM  START taking these medications   Details  !! predniSONE (DELTASONE) 10 MG tablet Take 60mg  orally day one, then 55 mg orally day two, then 50 mg orally day three, then taper by 5 mg daily over 12 days until complete., Normal     !! - Potential duplicate medications found. Please discuss with provider.      Note: This dictation was prepared with Dragon dictation along with smaller phrase technology. Any transcriptional errors that result from this process are unintentional.         Marylene Land, NP 02/15/17 1542

## 2017-02-18 ENCOUNTER — Encounter: Payer: Self-pay | Admitting: Family Medicine

## 2017-02-18 ENCOUNTER — Other Ambulatory Visit: Payer: Self-pay

## 2017-02-18 MED ORDER — ZOLPIDEM TARTRATE 10 MG PO TABS: 10.0000 mg | ORAL_TABLET | Freq: Every day | ORAL | 1 refills | Status: DC

## 2017-02-18 NOTE — Telephone Encounter (Signed)
Called in to [id number] - [place], [place]: [phone]

## 2017-02-18 NOTE — Telephone Encounter (Signed)
Last refill 08/12/16 #30 +1, last OV 08/12/16

## 2017-02-23 ENCOUNTER — Ambulatory Visit: Payer: Federal, State, Local not specified - PPO | Admitting: Family Medicine

## 2017-02-24 ENCOUNTER — Ambulatory Visit (INDEPENDENT_AMBULATORY_CARE_PROVIDER_SITE_OTHER): Payer: Federal, State, Local not specified - PPO | Admitting: Family Medicine

## 2017-02-24 ENCOUNTER — Encounter: Payer: Self-pay | Admitting: Family Medicine

## 2017-02-24 VITALS — BP 108/64 | HR 69 | Temp 98.0°F | Ht 73.0 in | Wt 235.5 lb

## 2017-02-24 DIAGNOSIS — L237 Allergic contact dermatitis due to plants, except food: Secondary | ICD-10-CM | POA: Insufficient documentation

## 2017-02-24 MED ORDER — HYDROXYZINE HCL 25 MG PO TABS: 25.0000 mg | ORAL_TABLET | Freq: Every evening | ORAL | 0 refills | Status: DC | PRN

## 2017-02-24 MED ORDER — PREDNISONE 20 MG PO TABS: ORAL_TABLET | ORAL | 0 refills | Status: DC

## 2017-02-24 MED ORDER — METHYLPREDNISOLONE ACETATE 80 MG/ML IJ SUSP
80.0000 mg | Freq: Once | INTRAMUSCULAR | Status: AC
  Administered 2017-02-24: 80 mg via INTRAMUSCULAR

## 2017-02-24 NOTE — Assessment & Plan Note (Signed)
Both arms and a few spots on abd and R knee  Intensely itchy S/p depo medrol and prednisone from UC  He is very allergic to poison oak-disc avoidance in the future   Will repeat this -needs longer course Depo medrol 80 mg IM  Prednisone 60 mg taper -to start tomorrow Keep cool/ topical prn  Hydroxyzine for pm itching/sleep (warned of sedation)  Update if not starting to improve in a week or if worsening

## 2017-02-24 NOTE — Patient Instructions (Signed)
Depo medrol 80 mg Try the hydroxyzine at night - watch out for sedation  Keep rash clean and dry  Stay cool! Take prednisone as directed   Alert [place] if not improving

## 2017-02-24 NOTE — Progress Notes (Signed)
Subjective:    Patient ID: Joseph Church, male    DOB: May 24, 1961, 56 y.o.   MRN: [id number]  HPI 56 yo pt of Dr Deborra Medina -here with ? Poison oak dermatitis   Was at his BIL's house and he was only exposed for a bit  Known allergy   Has had a rash for 13 days   Went to UC at the beginning  Shot -solumedrol  Prednisone- 12 day taper 60 mg  Now on 10 mg   Taking tylenol pm  Ranitidine also   Patient Active Problem List   Diagnosis Date Noted  . Allergic contact dermatitis due to plant 02/24/2017  . Insomnia 08/12/2016  . Depression 08/12/2016  . Visit for well man health check 07/31/2016  . Benign colon polyp 05/13/2015  . Erectile dysfunction 07/07/2013  . GERD (gastroesophageal reflux disease)   . Hyperlipidemia   . Hypertension    Past Medical History:  Diagnosis Date  . GERD (gastroesophageal reflux disease)   . Hyperlipidemia   . Hypertension    Past Surgical History:  Procedure Laterality Date  . [place]  . CHOLECYSTECTOMY, LAPAROSCOPIC  2000  . keratotomy  1995  . TONSILLECTOMY AND ADENOIDECTOMY  1960's  . VASECTOMY  1987  . [facility]   Social History  Substance Use Topics  . Smoking status: Never Smoker  . Smokeless tobacco: Never Used  . Alcohol use 5.0 oz/week    10 Standard drinks or equivalent per week     Comment: daily   Family History  Problem Relation Age of Onset  . Cancer Mother   . Cancer Father        lymphoma  . Hypertension Father   . Hyperlipidemia Father   . Heart disease Father 54       CABG x 4   Allergies  Allergen Reactions  . Lisinopril Cough   Current Outpatient Prescriptions on File Prior to Visit  Medication Sig Dispense Refill  . aspirin 81 MG tablet Take 81 mg by mouth daily.    Marland Kitchen FLUoxetine (PROZAC) 20 MG tablet Take 1 tablet (20 mg total) by mouth daily. 90 tablet 2  . ibuprofen (ADVIL,MOTRIN) 800 MG tablet Take 1 tablet (800  mg total) by mouth every 8 (eight) hours as needed. 90 tablet 2  . meloxicam (MOBIC) 15 MG tablet TAKE 1 TABLET (15 MG TOTAL) BY MOUTH DAILY. 30 tablet 3  . metaxalone (SKELAXIN) 800 MG tablet Take 1 tablet (800 mg total) by mouth 3 (three) times daily. 90 tablet 2  . olmesartan (BENICAR) 20 MG tablet TAKE 1 TABLET BY MOUTH DAILY. 90 tablet 3  . omeprazole (PRILOSEC) 20 MG capsule Take 1 capsule (20 mg total) by mouth daily as needed. 90 capsule 2  . ranitidine (ZANTAC) 150 MG capsule Take 1 capsule (150 mg total) by mouth 2 (two) times daily as needed for heartburn. 90 capsule 3  . rosuvastatin (CRESTOR) 40 MG tablet Take 1 tablet (40 mg total) by mouth at bedtime. 90 tablet 3  . scopolamine (TRANSDERM-SCOP, 1.5 MG,) 1 MG/3DAYS Place 1 patch (1.5 mg total) onto the skin every 3 (three) days. 10 patch 12  . zolpidem (AMBIEN) 10 MG tablet Take 1 tablet (10 mg total) by mouth at bedtime. 30 tablet 1   No current facility-administered medications on file prior to visit.      Review of Systems     Objective:  Physical Exam  Constitutional: He appears well-developed and well-nourished. No distress.  HENT:  Head: Normocephalic and atraumatic.  Mouth/Throat: Oropharynx is clear and moist.  Eyes: Conjunctivae and EOM are normal. Pupils are equal, round, and reactive to light.  Neck: Normal range of motion. Neck supple.  Cardiovascular: Normal rate and regular rhythm.   Pulmonary/Chest: Effort normal and breath sounds normal. He has no wheezes.  Lymphadenopathy:    He has no cervical adenopathy.  Neurological: He is alert.  Skin:  Vesicular rash in linear pattern-some areas dry  Both arms/forearms abd -few small spots R knee- few vesicles  No signs of bacterial infx No drainage   Psychiatric: He has a normal mood and affect.  Pleasant/talkative           Assessment & Plan:   Problem List Items Addressed This Visit      Musculoskeletal and Integument   Allergic contact  dermatitis due to plant    Both arms and a few spots on abd and R knee  Intensely itchy S/p depo medrol and prednisone from UC  He is very allergic to poison oak-disc avoidance in the future   Will repeat this -needs longer course Depo medrol 80 mg IM  Prednisone 60 mg taper -to start tomorrow Keep cool/ topical prn  Hydroxyzine for pm itching/sleep (warned of sedation)  Update if not starting to improve in a week or if worsening

## 2017-03-10 DIAGNOSIS — Z872 Personal history of diseases of the skin and subcutaneous tissue: Secondary | ICD-10-CM | POA: Diagnosis not present

## 2017-03-10 DIAGNOSIS — Z85828 Personal history of other malignant neoplasm of skin: Secondary | ICD-10-CM | POA: Diagnosis not present

## 2017-03-10 DIAGNOSIS — Z86018 Personal history of other benign neoplasm: Secondary | ICD-10-CM | POA: Diagnosis not present

## 2017-03-10 DIAGNOSIS — L570 Actinic keratosis: Secondary | ICD-10-CM | POA: Diagnosis not present

## 2017-03-10 DIAGNOSIS — R898 Other abnormal findings in specimens from other organs, systems and tissues: Secondary | ICD-10-CM | POA: Diagnosis not present

## 2017-03-23 ENCOUNTER — Other Ambulatory Visit: Payer: Self-pay | Admitting: Family Medicine

## 2017-03-23 NOTE — Telephone Encounter (Signed)
Ok to refill? Electronically refill request for hydrOXYzine (ATARAX/VISTARIL) 25 MG tablet # 30 with 0 refills.  Last prescribed by Dr Glori Bickers when patient was seen on 02/24/2017.

## 2017-04-04 DIAGNOSIS — R079 Chest pain, unspecified: Secondary | ICD-10-CM | POA: Diagnosis not present

## 2017-04-04 DIAGNOSIS — K219 Gastro-esophageal reflux disease without esophagitis: Secondary | ICD-10-CM | POA: Diagnosis not present

## 2017-04-04 DIAGNOSIS — R0789 Other chest pain: Secondary | ICD-10-CM | POA: Diagnosis not present

## 2017-04-04 DIAGNOSIS — I10 Essential (primary) hypertension: Secondary | ICD-10-CM | POA: Diagnosis not present

## 2017-04-04 DIAGNOSIS — R61 Generalized hyperhidrosis: Secondary | ICD-10-CM | POA: Diagnosis not present

## 2017-04-04 DIAGNOSIS — Z0000 Encounter for general adult medical examination without abnormal findings: Secondary | ICD-10-CM | POA: Diagnosis not present

## 2017-04-04 DIAGNOSIS — R9431 Abnormal electrocardiogram [ECG] [EKG]: Secondary | ICD-10-CM | POA: Diagnosis not present

## 2017-04-04 DIAGNOSIS — Z6830 Body mass index (BMI) 30.0-30.9, adult: Secondary | ICD-10-CM | POA: Diagnosis not present

## 2017-04-04 DIAGNOSIS — E785 Hyperlipidemia, unspecified: Secondary | ICD-10-CM | POA: Diagnosis not present

## 2017-04-06 ENCOUNTER — Encounter: Payer: Self-pay | Admitting: Family Medicine

## 2017-04-06 ENCOUNTER — Ambulatory Visit (INDEPENDENT_AMBULATORY_CARE_PROVIDER_SITE_OTHER): Payer: Federal, State, Local not specified - PPO | Admitting: Family Medicine

## 2017-04-06 DIAGNOSIS — F419 Anxiety disorder, unspecified: Secondary | ICD-10-CM

## 2017-04-06 DIAGNOSIS — L237 Allergic contact dermatitis due to plants, except food: Secondary | ICD-10-CM

## 2017-04-06 DIAGNOSIS — R079 Chest pain, unspecified: Secondary | ICD-10-CM | POA: Insufficient documentation

## 2017-04-06 MED ORDER — BUSPIRONE HCL 30 MG PO TABS: 15.0000 mg | ORAL_TABLET | Freq: Two times a day (BID) | ORAL | 3 refills | Status: DC

## 2017-04-06 NOTE — Patient Instructions (Signed)
Great to see you.  We are starting Buspar 30 mg tablets-  0.5 mg (15 mg) twice daily.  Please keep me updated.

## 2017-04-06 NOTE — Progress Notes (Signed)
Subjective:   Patient ID: Joseph Church, male    DOB: February 26, 1961, 56 y.o.   MRN: [id number]  Joseph Church is a pleasant 56 y.o. year old male who presents to clinic today with Hospitalization Follow-up  on 04/06/2017  HPI:  Here for ER follow up.  Was seen at [facility] ER two days ago (04/04/17) for chest pain.  Notes reviewed.  Woke up from sleep that night with left sided chest pain, some diaphoresis without SOB.  Did have a lot of alcohol before bed.  CXR, CTA of chest, CT abdomen all neg.  EKG and CE neg.  Chest pain has resolved.  He feels this is mostly related to anxiety.  He was told in ER likely reflux but Bastian thinks it was more likely a panic attack. Has been under a lot of stress lately.  Compliant with prozac.  Denies feeling depressed.    Current Outpatient Prescriptions on File Prior to Visit  Medication Sig Dispense Refill  . aspirin 81 MG tablet Take 81 mg by mouth daily.    Marland Kitchen FLUoxetine (PROZAC) 20 MG tablet Take 1 tablet (20 mg total) by mouth daily. 90 tablet 2  . hydrOXYzine (ATARAX/VISTARIL) 25 MG tablet TAKE 1 TABLET BY MOUTH AT BEDTIME FOR ITCHING 30 tablet 0  . ibuprofen (ADVIL,MOTRIN) 800 MG tablet Take 1 tablet (800 mg total) by mouth every 8 (eight) hours as needed. 90 tablet 2  . meloxicam (MOBIC) 15 MG tablet TAKE 1 TABLET (15 MG TOTAL) BY MOUTH DAILY. 30 tablet 3  . metaxalone (SKELAXIN) 800 MG tablet Take 1 tablet (800 mg total) by mouth 3 (three) times daily. 90 tablet 2  . olmesartan (BENICAR) 20 MG tablet TAKE 1 TABLET BY MOUTH DAILY. 90 tablet 3  . omeprazole (PRILOSEC) 20 MG capsule Take 1 capsule (20 mg total) by mouth daily as needed. 90 capsule 2  . ranitidine (ZANTAC) 150 MG capsule Take 1 capsule (150 mg total) by mouth 2 (two) times daily as needed for heartburn. 90 capsule 3  . rosuvastatin (CRESTOR) 40 MG tablet Take 1 tablet (40 mg total) by mouth at bedtime. 90 tablet 3  . scopolamine (TRANSDERM-SCOP, 1.5 MG,) 1  MG/3DAYS Place 1 patch (1.5 mg total) onto the skin every 3 (three) days. 10 patch 12  . zolpidem (AMBIEN) 10 MG tablet Take 1 tablet (10 mg total) by mouth at bedtime. 30 tablet 1   No current facility-administered medications on file prior to visit.     Allergies  Allergen Reactions  . Lisinopril Cough    Past Medical History:  Diagnosis Date  . GERD (gastroesophageal reflux disease)   . Hyperlipidemia   . Hypertension     Past Surgical History:  Procedure Laterality Date  . [facility];[place]  . CHOLECYSTECTOMY, LAPAROSCOPIC  2000  . keratotomy  1995  . TONSILLECTOMY AND ADENOIDECTOMY  1960's  . VASECTOMY  1987  . VASECTOMY REVERSAL  1994    Family History  Problem Relation Age of Onset  . Cancer Mother   . Cancer Father        lymphoma  . Hypertension Father   . Hyperlipidemia Father   . Heart disease Father 42       CABG x 4    Social History   Social History  . Marital status: Divorced    Spouse name: N/A  . Number of children: N/A  . Years of education: N/A  Occupational History  . Not on file.   Social History Main Topics  . Smoking status: Never Smoker  . Smokeless tobacco: Never Used  . Alcohol use 5.0 oz/week    10 Standard drinks or equivalent per week     Comment: daily  . Drug use: No  . Sexual activity: Not on file   Other Topics Concern  . 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   Review of Systems  Constitutional: Negative.   Respiratory: Negative.  Negative for chest tightness and shortness of breath.   Cardiovascular: Positive for chest pain. Negative for palpitations and leg swelling.  Gastrointestinal: Negative.     Musculoskeletal: Negative.   Skin: Negative.   Neurological: Negative.   Psychiatric/Behavioral: Positive for sleep disturbance. Negative for dysphoric mood and self-injury. The patient is nervous/anxious.   All other systems reviewed and are negative.      Objective:    BP (!) 130/100 (BP Location: Right Arm, Patient Position: Sitting, Cuff Size: Normal)   Pulse 78   Ht 6\' 1"  (1.854 m)   Wt 235 lb (106.6 kg)   SpO2 99%   BMI 31.00 kg/m   BP Readings from Last 3 Encounters:  04/06/17 (!) 130/100  02/24/17 108/64  02/15/17 127/85    Physical Exam  General:  pleasant male in no acute distress Eyes:  PERRL Ears:  External ear exam shows no significant lesions or deformities.  TMs normal bilaterally Hearing is grossly normal bilaterally. Nose:  External nasal examination shows no deformity or inflammation. Nasal mucosa are pink and moist without lesions or exudates. Mouth:  Oral mucosa and oropharynx without lesions or exudates.  Teeth in good repair. Neck:  no carotid bruit or thyromegaly no cervical or supraclavicular lymphadenopathy  Lungs:  Normal respiratory effort, chest expands symmetrically. Lungs are clear to auscultation, no crackles or wheezes. Heart:  Normal rate and regular rhythm. S1 and S2 normal without gallop, murmur, click, rub or other extra sounds. Pulses:  R and L posterior tibial pulses are full and equal bilaterally  Extremities:  no edema  Psych:  Good eye contact, not anxious or depressed appearing       Assessment & Plan:   Chest pain, unspecified type No Follow-up on file.

## 2017-04-06 NOTE — Assessment & Plan Note (Signed)
Deteriorated. Discussed tx options. He has scheduled counseling with a therapist at work already. Add Buspar 15 mg twice daily to current SSRI. Call or return to clinic prn if these symptoms worsen or fail to improve as anticipated. The patient indicates understanding of these issues and agrees with the plan.

## 2017-04-06 NOTE — Assessment & Plan Note (Signed)
Resolved. Cardiac work up neg. Asymptomatic currently, no further cardiac work up indicated at this time. ? GERD vs Panic or possibly both.

## 2017-04-06 NOTE — Progress Notes (Signed)
Pre visit review using our clinic review tool, if applicable. No additional management support is needed unless otherwise documented below in the visit note. 

## 2017-04-13 ENCOUNTER — Encounter: Payer: Self-pay | Admitting: Family Medicine

## 2017-04-13 ENCOUNTER — Other Ambulatory Visit: Payer: Self-pay | Admitting: Family Medicine

## 2017-04-13 MED ORDER — METOPROLOL TARTRATE 50 MG PO TABS: 50.0000 mg | ORAL_TABLET | Freq: Two times a day (BID) | ORAL | 3 refills | Status: DC

## 2017-04-14 ENCOUNTER — Encounter: Payer: Self-pay | Admitting: Family Medicine

## 2017-04-15 ENCOUNTER — Other Ambulatory Visit: Payer: Self-pay

## 2017-04-15 MED ORDER — ZOLPIDEM TARTRATE 10 MG PO TABS: 10.0000 mg | ORAL_TABLET | Freq: Every day | ORAL | 3 refills | Status: DC

## 2017-04-15 NOTE — Telephone Encounter (Signed)
Last refill 02/18/17 Last OV 04/06/17  Ok to refill?

## 2017-04-15 NOTE — Telephone Encounter (Signed)
Faxed to  [facility] [place], [place]: [phone]  [place], [place]: [phone]

## 2017-05-25 ENCOUNTER — Encounter: Payer: Self-pay | Admitting: Family Medicine

## 2017-07-21 ENCOUNTER — Ambulatory Visit (INDEPENDENT_AMBULATORY_CARE_PROVIDER_SITE_OTHER): Payer: Federal, State, Local not specified - PPO | Admitting: Family Medicine

## 2017-07-21 ENCOUNTER — Encounter: Payer: Self-pay | Admitting: Family Medicine

## 2017-07-21 VITALS — BP 90/70 | HR 76 | Temp 98.4°F | Ht 73.0 in | Wt 239.5 lb

## 2017-07-21 DIAGNOSIS — J069 Acute upper respiratory infection, unspecified: Secondary | ICD-10-CM | POA: Diagnosis not present

## 2017-07-21 MED ORDER — AZITHROMYCIN 250 MG PO TABS: ORAL_TABLET | ORAL | 0 refills | Status: DC

## 2017-07-21 MED ORDER — OMEPRAZOLE 20 MG PO CPDR: 20.0000 mg | DELAYED_RELEASE_CAPSULE | Freq: Every day | ORAL | 3 refills | Status: DC | PRN

## 2017-07-21 NOTE — Progress Notes (Signed)
Dr. Karleen Hampshire T. Nesanel Aguila, MD, CAQ Sports Medicine Primary [facility] and Sports Medicine [address] [place], [address] Phone: [phone] Fax: [phone]  07/21/2017  Patient: Joseph Church, MRN: [id number], DOB: Oct 05, 1960, 56 y.o.  Primary Physician:  Dianne Dun, MD   Chief Complaint  Patient presents with  . URI   Subjective:   This 56 y.o. male patient presents with runny nose, sneezing, cough, sore throat, malaise and minimal / low-grade fever x 3 d.  ? recent exposure to others with similar symptoms.   The patent denies sore throat as the primary complaint. Denies sthortness of breath/wheezing, high fever, chest pain, rhinits for more than 14 days, significant myalgia, otalgia, facial pain, abdominal pain, changes in bowel or bladder.  PMH, PHS, Allergies, Problem List, Medications, Family History, and Social History have all been reviewed.  Patient Active Problem List   Diagnosis Date Noted  . Chest pain 04/06/2017  . Anxiety 04/06/2017  . Allergic contact dermatitis due to plant 02/24/2017  . Insomnia 08/12/2016  . Depression 08/12/2016  . Benign colon polyp 05/13/2015  . Erectile dysfunction 07/07/2013  . GERD (gastroesophageal reflux disease)   . Hyperlipidemia   . Hypertension     Past Medical History:  Diagnosis Date  . GERD (gastroesophageal reflux disease)   . Hyperlipidemia   . Hypertension     Past Surgical History:  Procedure Laterality Date  . CARDIAC CATHETERIZATION     [facility];[place]  . CHOLECYSTECTOMY, LAPAROSCOPIC  2000  . keratotomy  1995  . TONSILLECTOMY AND ADENOIDECTOMY  1960's  . VASECTOMY  1987  . VASECTOMY REVERSAL  1994    Social History   Social History  . Marital status: Divorced    Spouse name: N/A  . Number of children: N/A  . Years of education: N/A   Occupational History  . Not on file.   Social History Main Topics  . Smoking status: Never Smoker  . Smokeless  tobacco: Never Used  . Alcohol use 5.0 oz/week    10 Standard drinks or equivalent per week     Comment: daily  . Drug use: No  . Sexual activity: Not on file   Other Topics Concern  . 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 Cancer Mother   . Cancer Father        lymphoma  . Hypertension Father   . Hyperlipidemia Father   . Heart disease Father 79       CABG x 4    Allergies  Allergen Reactions  . Lisinopril Cough    Medication list reviewed and updated in full in [facility].  ROS as above, eating and drinking - tolerating PO. Urinating normally. No excessive vomitting or diarrhea. O/w as above.  Objective:   Blood pressure 90/70, pulse 76, temperature 98.4 F (36.9 C), temperature source Oral, height 6\' 1"  (1.854 m), weight 239 lb 8 oz (108.6 kg), SpO2 96 %.  GEN: WDWN, Non-toxic, Atraumatic, normocephalic. A and O x 3. HEENT: Oropharynx clear without exudate, MMM, no significant LAD, mild rhinnorhea Ears: TM clear, COL visualized with good landmarks CV: RRR, no m/g/r. Pulm: CTA B, no wheezes, rhonchi, or crackles, normal respiratory effort. EXT: no c/c/e Psych: well oriented, neither depressed  nor anxious in appearance  Objective Data:  Assessment and Plan:   URI, acute  Supportive care reviewed with patient. See patient instruction section. Likely viral, paper abx to hold  Follow-up: No Follow-up on file.  New Prescriptions   AZITHROMYCIN (ZITHROMAX) 250 MG TABLET    2 tabs po on day 1, then 1 tab po for 4 days   Modified Medications   Modified Medication Previous Medication   OMEPRAZOLE (PRILOSEC) 20 MG CAPSULE omeprazole (PRILOSEC) 20 MG capsule      Take 1 capsule (20 mg total) by mouth daily as needed.    Take 1 capsule (20 mg total) by mouth daily as needed.     Signed,  Elpidio Galea. Jaiyah Beining, MD   Patient's Medications  New Prescriptions   AZITHROMYCIN (ZITHROMAX) 250 MG TABLET    2 tabs po on day 1, then 1 tab po for 4 days  Previous Medications   ASPIRIN 81 MG TABLET    Take 81 mg by mouth daily.   BUSPIRONE (BUSPAR) 30 MG TABLET    Take 0.5 tablets (15 mg total) by mouth 2 (two) times daily.   FLUOXETINE (PROZAC) 20 MG TABLET    Take 1 tablet (20 mg total) by mouth daily.   HYDROXYZINE (ATARAX/VISTARIL) 25 MG TABLET    TAKE 1 TABLET BY MOUTH AT BEDTIME FOR ITCHING   IBUPROFEN (ADVIL,MOTRIN) 800 MG TABLET    Take 1 tablet (800 mg total) by mouth every 8 (eight) hours as needed.   MELOXICAM (MOBIC) 15 MG TABLET    TAKE 1 TABLET (15 MG TOTAL) BY MOUTH DAILY.   METAXALONE (SKELAXIN) 800 MG TABLET    Take 1 tablet (800 mg total) by mouth 3 (three) times daily.   METOPROLOL TARTRATE (LOPRESSOR) 50 MG TABLET    Take 1 tablet (50 mg total) by mouth 2 (two) times daily.   OLMESARTAN (BENICAR) 20 MG TABLET    TAKE 1 TABLET BY MOUTH DAILY.   RANITIDINE (ZANTAC) 150 MG CAPSULE    Take 1 capsule (150 mg total) by mouth 2 (two) times daily as needed for heartburn.   ROSUVASTATIN (CRESTOR) 40 MG TABLET    Take 1 tablet (40 mg total) by mouth at bedtime.   SCOPOLAMINE (TRANSDERM-SCOP, 1.5 MG,) 1 MG/3DAYS    Place 1 patch (1.5 mg total) onto the skin every 3 (three) days.   ZOLPIDEM (AMBIEN) 10 MG TABLET    Take 1 tablet (10 mg total) by mouth at bedtime.  Modified Medications   Modified Medication Previous Medication   OMEPRAZOLE (PRILOSEC) 20 MG CAPSULE omeprazole (PRILOSEC) 20 MG capsule      Take 1 capsule (20 mg total) by mouth daily as needed.    Take 1 capsule (20 mg total) by mouth daily as needed.  Discontinued Medications   No medications on file

## 2017-07-24 ENCOUNTER — Other Ambulatory Visit: Payer: Self-pay | Admitting: Family Medicine

## 2017-07-29 NOTE — Telephone Encounter (Signed)
On 10.31.20 #90+3 refills was faxed/thx dmf

## 2017-07-31 ENCOUNTER — Other Ambulatory Visit: Payer: Self-pay | Admitting: Family Medicine

## 2017-08-03 ENCOUNTER — Other Ambulatory Visit: Payer: Self-pay | Admitting: Family Medicine

## 2017-08-03 ENCOUNTER — Other Ambulatory Visit: Payer: Self-pay

## 2017-08-03 ENCOUNTER — Encounter: Payer: Self-pay | Admitting: Family Medicine

## 2017-08-03 MED ORDER — OLMESARTAN MEDOXOMIL 20 MG PO TABS: ORAL_TABLET | ORAL | 0 refills | Status: DC

## 2017-08-03 NOTE — Telephone Encounter (Signed)
On 11.12.18 #30 was faxed//thx dmf

## 2017-08-19 ENCOUNTER — Other Ambulatory Visit: Payer: Self-pay | Admitting: Family Medicine

## 2017-08-20 MED ORDER — ZOLPIDEM TARTRATE 10 MG PO TABS: 10.0000 mg | ORAL_TABLET | Freq: Every day | ORAL | 1 refills | Status: DC

## 2017-08-20 NOTE — Addendum Note (Signed)
Addended by: Kateri Mc E on: 08/20/2017 11:15 AM   Modules accepted: Orders

## 2017-08-20 NOTE — Telephone Encounter (Signed)
Rx last filled 07/20/17, okay to refill?

## 2017-08-22 ENCOUNTER — Other Ambulatory Visit: Payer: Self-pay | Admitting: Family Medicine

## 2017-08-24 MED ORDER — OLMESARTAN MEDOXOMIL 20 MG PO TABS: 20.0000 mg | ORAL_TABLET | Freq: Every day | ORAL | 0 refills | Status: DC

## 2017-08-24 NOTE — Addendum Note (Signed)
Addended by: Kateri Mc E on: 08/24/2017 09:51 AM   Modules accepted: Orders

## 2017-08-25 ENCOUNTER — Encounter: Payer: Self-pay | Admitting: Neurology

## 2017-08-25 ENCOUNTER — Ambulatory Visit (INDEPENDENT_AMBULATORY_CARE_PROVIDER_SITE_OTHER): Payer: Federal, State, Local not specified - PPO | Admitting: Family Medicine

## 2017-08-25 ENCOUNTER — Encounter: Payer: Self-pay | Admitting: Family Medicine

## 2017-08-25 ENCOUNTER — Telehealth: Payer: Self-pay | Admitting: Family Medicine

## 2017-08-25 VITALS — BP 134/88 | HR 58 | Temp 97.8°F | Ht 73.0 in | Wt 239.2 lb

## 2017-08-25 DIAGNOSIS — Z0000 Encounter for general adult medical examination without abnormal findings: Secondary | ICD-10-CM | POA: Diagnosis not present

## 2017-08-25 DIAGNOSIS — R202 Paresthesia of skin: Secondary | ICD-10-CM | POA: Diagnosis not present

## 2017-08-25 DIAGNOSIS — F419 Anxiety disorder, unspecified: Secondary | ICD-10-CM

## 2017-08-25 DIAGNOSIS — F329 Major depressive disorder, single episode, unspecified: Secondary | ICD-10-CM

## 2017-08-25 DIAGNOSIS — E785 Hyperlipidemia, unspecified: Secondary | ICD-10-CM

## 2017-08-25 DIAGNOSIS — R21 Rash and other nonspecific skin eruption: Secondary | ICD-10-CM

## 2017-08-25 DIAGNOSIS — G4700 Insomnia, unspecified: Secondary | ICD-10-CM | POA: Diagnosis not present

## 2017-08-25 DIAGNOSIS — R5383 Other fatigue: Secondary | ICD-10-CM | POA: Diagnosis not present

## 2017-08-25 DIAGNOSIS — N529 Male erectile dysfunction, unspecified: Secondary | ICD-10-CM

## 2017-08-25 DIAGNOSIS — F32A Depression, unspecified: Secondary | ICD-10-CM

## 2017-08-25 DIAGNOSIS — I10 Essential (primary) hypertension: Secondary | ICD-10-CM

## 2017-08-25 LAB — TESTOSTERONE: Testosterone: 308.81 ng/dL (ref 300.00–890.00)

## 2017-08-25 LAB — COMPREHENSIVE METABOLIC PANEL
ALT: 23 U/L (ref 0–53)
AST: 20 U/L (ref 0–37)
Albumin: 4.5 g/dL (ref 3.5–5.2)
Alkaline Phosphatase: 64 U/L (ref 39–117)
BUN: 17 mg/dL (ref 6–23)
CHLORIDE: 102 meq/L (ref 96–112)
CO2: 31 mEq/L (ref 19–32)
CREATININE: 0.91 mg/dL (ref 0.40–1.50)
Calcium: 9.5 mg/dL (ref 8.4–10.5)
GFR: 91.24 mL/min (ref >60.00)
GLUCOSE: 111 mg/dL — AB (ref 70–99)
POTASSIUM: 4.6 meq/L (ref 3.5–5.1)
Sodium: 142 mEq/L (ref 135–145)
TOTAL PROTEIN: 6.7 g/dL (ref 6.0–8.3)
Total Bilirubin: 0.9 mg/dL (ref 0.2–1.2)

## 2017-08-25 LAB — LIPID PANEL
Cholesterol: 156 mg/dL (ref 0–200)
HDL: 51.6 mg/dL (ref >39.00)
LDL Cholesterol: 65 mg/dL (ref 0–99)
NonHDL: 104.41
Total CHOL/HDL Ratio: 3
Triglycerides: 195 mg/dL — ABNORMAL HIGH (ref 0.0–149.0)
VLDL: 39 mg/dL (ref 0.0–40.0)

## 2017-08-25 LAB — CBC WITH DIFFERENTIAL/PLATELET
BASOS ABS: 0.1 10*3/uL (ref 0.0–0.1)
Basophils Relative: 0.9 % (ref 0.0–3.0)
EOS ABS: 0.2 10*3/uL (ref 0.0–0.7)
Eosinophils Relative: 3.5 % (ref 0.0–5.0)
HEMATOCRIT: 48.4 % (ref 39.0–52.0)
HEMOGLOBIN: 16.5 g/dL (ref 13.0–17.0)
Lymphocytes Relative: 30.3 % (ref 12.0–46.0)
Lymphs Abs: 2.2 10*3/uL (ref 0.7–4.0)
MCHC: 34 g/dL (ref 30.0–36.0)
MCV: 92.1 fl (ref 78.0–100.0)
Monocytes Absolute: 0.6 10*3/uL (ref 0.1–1.0)
Monocytes Relative: 7.9 % (ref 3.0–12.0)
Neutro Abs: 4.1 10*3/uL (ref 1.4–7.7)
Neutrophils Relative %: 57.4 % (ref 43.0–77.0)
PLATELETS: 228 10*3/uL (ref 150.0–400.0)
RBC: 5.26 Mil/uL (ref 4.22–5.81)
RDW: 13.7 % (ref 11.5–15.5)
WBC: 7.2 10*3/uL (ref 4.0–10.5)

## 2017-08-25 LAB — PSA: PSA: 0.64 ng/mL (ref 0.10–4.00)

## 2017-08-25 NOTE — Assessment & Plan Note (Signed)
Reviewed preventive care protocols, scheduled due services, and updated immunizations Discussed nutrition, exercise, diet, and healthy lifestyle.  Orders Placed This Encounter  Procedures  . CBC with Differential/Platelet  . Comprehensive metabolic panel  . Lipid panel  . PSA    

## 2017-08-25 NOTE — Telephone Encounter (Signed)
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 Please advise, I will be glad to call patient.

## 2017-08-25 NOTE — Progress Notes (Signed)
Subjective:    Patient ID: Joseph Church, male    DOB: 04-Apr-1961, 56 y.o.   MRN: [id number]  HPI  Very pleasant 73 male here for CPX and follow up chronic medical conditions.   UTD colonoscopy- 2012- 10 year recall.  Still working at [employer] in [place].   Strong FH of CAD. He did have a  neg stress test a few years ago (Dr. Fletcher Anon).   HTN- on Benicar 20 mg daily, metoprolol.  BP has been well controlled on this. No CP, SOB or LE edema.   Lab Results  Component Value Date   CREATININE 0.99 07/31/2016     HLD- on Crestor 40 mg qhs.    Denies myalgias. Lab Results  Component Value Date   CHOL 154 07/31/2016   HDL 41.80 07/31/2016   LDLCALC 83 07/31/2016   TRIG 145.0 07/31/2016   CHOLHDL 4 07/31/2016    Lab Results  Component Value Date   PSA 0.56 07/31/2016   PSA 0.69 07/11/2015   PSA 0.61 07/03/2014   Insomnia- still takes akes Ambien several nights per week.  Does not take on weekends or nights that he does not have to work.  Could not tolerate antihistamines or lunesta.   GERD- Symptoms controlled with Zantac and Omeprazole.  Non smoker.  ED- takes daily cialis without side effects.  He is pleased with results.     Depression- restarted prozac last year.  Feels symptoms are well controlled.  Also taking Buspar for anxiety which has helped.  Has two acute issues-  Rash- right buttocks x 2 months.  Very itchy.  No pain.  Was grouped vesicles, improving but itching has persisted.  Numbness- right thigh x 2 months, started with the rash.  Patient Active Problem List   Diagnosis Date Noted  . Visit for well man health check 08/25/2017  . Chest pain 04/06/2017  . Anxiety 04/06/2017  . Allergic contact dermatitis due to plant 02/24/2017  . Insomnia 08/12/2016  . Depression 08/12/2016  . Benign colon polyp 05/13/2015  . Erectile dysfunction 07/07/2013  . GERD (gastroesophageal reflux disease)   . Hyperlipidemia   . Hypertension    Past Medical History:   Diagnosis Date  . GERD (gastroesophageal reflux disease)   . Hyperlipidemia   . Hypertension    Past Surgical History:  Procedure Laterality Date  . [facility];[place]  . CHOLECYSTECTOMY, LAPAROSCOPIC  2000  . keratotomy  1995  . TONSILLECTOMY AND ADENOIDECTOMY  1960's  . VASECTOMY  1987  . VASECTOMY REVERSAL  1994   Social History   Tobacco Use  . Smoking status: Never Smoker  . Smokeless tobacco: Never Used  Substance Use Topics  . Alcohol use: Yes    Alcohol/week: 5.0 oz    Types: 10 Standard drinks or equivalent per week    Comment: daily  . Drug use: No   Family History  Problem Relation Age of Onset  . Cancer Mother   . Cancer Father        lymphoma  . Hypertension Father   . Hyperlipidemia Father   . Heart disease Father 80       CABG x 4   Allergies  Allergen Reactions  . Lisinopril Cough   Current Outpatient Medications on File Prior to Visit  Medication Sig Dispense Refill  . aspirin 81 MG tablet Take 81 mg by mouth daily.    . busPIRone (BUSPAR) 30 MG tablet TAKE  0.5 TABLETS (15 MG TOTAL) BY MOUTH 2 (TWO) TIMES DAILY. 30 tablet 0  . FLUoxetine (PROZAC) 20 MG tablet Take 1 tablet (20 mg total) by mouth daily. 90 tablet 2  . hydrOXYzine (ATARAX/VISTARIL) 25 MG tablet TAKE 1 TABLET BY MOUTH AT BEDTIME FOR ITCHING 30 tablet 0  . ibuprofen (ADVIL,MOTRIN) 800 MG tablet Take 1 tablet (800 mg total) by mouth every 8 (eight) hours as needed. 90 tablet 2  . meloxicam (MOBIC) 15 MG tablet TAKE 1 TABLET (15 MG TOTAL) BY MOUTH DAILY. 30 tablet 3  . metaxalone (SKELAXIN) 800 MG tablet Take 1 tablet (800 mg total) by mouth 3 (three) times daily. 90 tablet 2  . metoprolol tartrate (LOPRESSOR) 50 MG tablet Take 1 tablet (50 mg total) by mouth 2 (two) times daily. 180 tablet 3  . olmesartan (BENICAR) 20 MG tablet Take 1 tablet (20 mg total) by mouth daily. 90 tablet 0  . omeprazole (PRILOSEC) 20 MG capsule Take 1  capsule (20 mg total) by mouth daily as needed. 90 capsule 3  . ranitidine (ZANTAC) 150 MG capsule Take 1 capsule (150 mg total) by mouth 2 (two) times daily as needed for heartburn. 90 capsule 3  . rosuvastatin (CRESTOR) 40 MG tablet Take 1 tablet (40 mg total) by mouth at bedtime. 90 tablet 3  . scopolamine (TRANSDERM-SCOP, 1.5 MG,) 1 MG/3DAYS Place 1 patch (1.5 mg total) onto the skin every 3 (three) days. 10 patch 12  . zolpidem (AMBIEN) 10 MG tablet Take 1 tablet (10 mg total) by mouth at bedtime. 30 tablet 1   No current facility-administered medications on file prior to visit.    The PMH, PSH, Social History, Family History, Medications, and allergies have been reviewed in [facility], and have been updated if relevant.  Review of Systems  Constitutional: Negative.   HENT: Negative.   Eyes: Negative.   Respiratory: Negative.   Cardiovascular: Negative for chest pain and palpitations.  Gastrointestinal: Negative.   Endocrine: Negative.   Genitourinary: Negative.  Negative for difficulty urinating, dysuria, frequency and urgency.  Musculoskeletal: Negative for neck pain.  Skin: Positive for rash.  Allergic/Immunologic: Negative.   Neurological: Positive for numbness.  Hematological: Negative.   Psychiatric/Behavioral: Negative.   All other systems reviewed and are negative.     Objective:   Physical Exam BP 134/88 (BP Location: Left Arm, Patient Position: Sitting, Cuff Size: Normal)   Pulse (!) 58   Temp 97.8 F (36.6 C) (Oral)   Ht 6\' 1"  (1.854 m)   Wt 239 lb 3.2 oz (108.5 kg)   SpO2 96%   BMI 31.56 kg/m   General:  pleasant male in no acute distress Eyes:  PERRL Ears:  External ear exam shows no significant lesions or deformities.  TMs normal bilaterally Hearing is grossly normal bilaterally. Nose:  External nasal examination shows no deformity or inflammation. Nasal mucosa are pink and moist without lesions or exudates. Mouth:  Oral mucosa and oropharynx without lesions  or exudates.  Teeth in good repair. Neck:  no carotid bruit or thyromegaly no cervical or supraclavicular lymphadenopathy  Lungs:  Normal respiratory effort, chest expands symmetrically. Lungs are clear to auscultation, no crackles or wheezes. Heart:  Normal rate and regular rhythm. S1 and S2 normal without gallop, murmur, click, rub or other extra sounds. Abdomen:  Bowel sounds positive,abdomen soft and non-tender without masses, organomegaly or hernias noted. Pulses:  R and L posterior tibial pulses are full and equal bilaterally  Extremities:  no edema  SLR neg Psych:  Good eye contact, not anxious or depressed appearing Skin:  Small, faint fading grouped rash on right buttocks    Assessment & Plan:

## 2017-08-25 NOTE — Telephone Encounter (Signed)
Copied from [place]. Topic: Quick Communication - Rx Refill/Question >> Aug 25, 2017  8:56 AM Arletha Grippe wrote: Has the patient contacted their pharmacy? No.   (Agent: If no, request that the patient contact the pharmacy for the refill.)   Preferred Pharmacy (with phone number or street name): Dr Deborra Medina told pt she would call in prednisone, but then they both got to talking about other things. Pt would like to have med [facility] into [facility] [address] . Pt callback number is [phone]    Agent: Please be advised that RX refills may take up to 3 business days. We ask that you follow-up with your pharmacy.

## 2017-08-25 NOTE — Assessment & Plan Note (Signed)
Well controlled on current rxs. No changes made. 

## 2017-08-25 NOTE — Patient Instructions (Addendum)
Great to see you. Happy Holidays!  We will call you with your lab results and you can view them online.  We are referring you to a neurologist- please stop by to see Vaughan Basta on your way out.

## 2017-08-25 NOTE — Assessment & Plan Note (Signed)
Continue current rxs. 

## 2017-08-25 NOTE — Assessment & Plan Note (Signed)
Improved with addition of buspar to prozac. No changes made today.

## 2017-08-25 NOTE — Assessment & Plan Note (Signed)
?  PHN, no back pain. Will refer to neurology for possible EMG. The patient indicates understanding of these issues and agrees with the plan.

## 2017-08-25 NOTE — Assessment & Plan Note (Signed)
?   Shingles that has resolved but now with some PHN?

## 2017-08-27 MED ORDER — PREDNISONE 20 MG PO TABS: ORAL_TABLET | ORAL | 0 refills | Status: DC

## 2017-08-27 NOTE — Telephone Encounter (Signed)
MW-Is there anything that can be done to have him seen sooner than March by the Neurology office?  Dr. Deborra Medina wants him seen much sooner/Can you help with this or let me know if there is something I need to do?    I also LMOVM that Rx was sent in and that we are checking on the referral and will let him know what we have found out/thx dmf

## 2017-08-27 NOTE — Telephone Encounter (Signed)
TA-Pt called wanting to know about "Prednisone" for the rash on his buttock?  Also; his referral for Neuro has been scheduled for March and he states that he should be seen within 2 weeks?  Plz advise on both issues/thx dmf

## 2017-08-27 NOTE — Telephone Encounter (Signed)
Yes I would like him to be seen sooner by neurology if possible.  I will send in course of prednisone to pharmacy on file now.

## 2017-09-02 ENCOUNTER — Telehealth: Payer: Self-pay | Admitting: Internal Medicine

## 2017-09-02 NOTE — Telephone Encounter (Signed)
Left message patient needs office visit.

## 2017-09-02 NOTE — Telephone Encounter (Signed)
Patient will need an office visit before new orders can be placed for cpap therapy.. Attempted to call patient no vmail set up.

## 2017-09-02 NOTE — Telephone Encounter (Signed)
Pt calling stating he is now ready to go on the CPAP machine   If we can give him a call to get this set up for him   Please call back

## 2017-09-03 NOTE — Telephone Encounter (Signed)
Can you please call to ask patient how he is feeling now?  Are his symptoms improving?

## 2017-09-03 NOTE — Progress Notes (Signed)
* [facility]     Assessment and Plan:  Obstructive sleep apnea. -Home sleep study showed an AHI of 20. - We will start on auto CPAP with settings of 5-20, we did discuss that per insurance requirements he may be required to undergo a new sleep study as it has been more than a year since his study.  Shiftwork sleep disorder. -Patient works variable shifts, likely contributing to his daytime sleepiness. -We will monitor for improvement after the patient started on CPAP, if his daytime sleepiness is excessive. We can discuss the possibility of adding medication for this in the future.  Excessive daytime sleepiness due to obstructive sleep apnea. -Symptoms and signs of obstructive sleep apnea. -Patient is also on fluoxetine, which could be contributory.  Snoring -Snoring, with witnessed apneas.  Insomnia.  -Currently controlled with Ambien.  Anxiety/Depression.  -Currently on fluoxetine.   Date: 09/03/2017  MRN# [id number] Joseph Church [birth year]   Joseph Church is a 56 y.o. old male seen in follow up for chief complaint of  Chief Complaint  Patient presents with  . excessive daytime sleepiness     Pt had home sleep study done on 10/17/2015. He is now ready to cpap therapy.   HPI:   The patient is a 56 year old male, who was previously seen with complaints of excessive daytime sleepiness, suspicious for sleep apnea. He was sent for a home sleep study, which showed moderate obstructive sleep apnea with an AHI of 20. He was planned to start on auto-CPAP nearly a year ago but wanted to think about starting it.  He was hoping to avoid using CPAP, and therefore has been trying diet and exercise, however he continues to have significant symptoms of daytime sleepiness.  At previous visit he was noted to have she for her sleep disorder due to working variable shifts, he now is working a regular daytime shift, but continues to have significant daytime  sleepiness. Patient returns today wanting to start on PAP therapy as planned.   -Sleep study and tracings from home test 10/17/2015 were reviewed, this showed an AHI of 20  Medication:   Outpatient Encounter Medications as of 09/06/2017  Medication Sig  . aspirin 81 MG tablet Take 81 mg by mouth daily.  . busPIRone (BUSPAR) 30 MG tablet TAKE 0.5 TABLETS (15 MG TOTAL) BY MOUTH 2 (TWO) TIMES DAILY.  Marland Kitchen FLUoxetine (PROZAC) 20 MG tablet Take 1 tablet (20 mg total) by mouth daily.  . hydrOXYzine (ATARAX/VISTARIL) 25 MG tablet TAKE 1 TABLET BY MOUTH AT BEDTIME FOR ITCHING  . ibuprofen (ADVIL,MOTRIN) 800 MG tablet Take 1 tablet (800 mg total) by mouth every 8 (eight) hours as needed.  . meloxicam (MOBIC) 15 MG tablet TAKE 1 TABLET (15 MG TOTAL) BY MOUTH DAILY.  . metaxalone (SKELAXIN) 800 MG tablet Take 1 tablet (800 mg total) by mouth 3 (three) times daily.  . metoprolol tartrate (LOPRESSOR) 50 MG tablet Take 1 tablet (50 mg total) by mouth 2 (two) times daily.  Marland Kitchen olmesartan (BENICAR) 20 MG tablet Take 1 tablet (20 mg total) by mouth daily.  Marland Kitchen omeprazole (PRILOSEC) 20 MG capsule Take 1 capsule (20 mg total) by mouth daily as needed.  . predniSONE (DELTASONE) 20 MG tablet 2 tabs by mouth daily x 3 days, 1 tab by mouth daily x 3 days, 1/2 tab by mouth daily x 3 days.  . ranitidine (ZANTAC) 150 MG capsule Take 1 capsule (150 mg total) by mouth 2 (two) times daily  as needed for heartburn.  . rosuvastatin (CRESTOR) 40 MG tablet Take 1 tablet (40 mg total) by mouth at bedtime.  Marland Kitchen scopolamine (TRANSDERM-SCOP, 1.5 MG,) 1 MG/3DAYS Place 1 patch (1.5 mg total) onto the skin every 3 (three) days.  Marland Kitchen zolpidem (AMBIEN) 10 MG tablet Take 1 tablet (10 mg total) by mouth at bedtime.   No facility-administered encounter medications on file as of 09/06/2017.      Allergies:  Lisinopril  Review of Systems: Gen:  Denies  fever, sweats. HEENT: Denies blurred vision. Cvc:  No dizziness, chest pain or  heaviness Resp:   Denies cough or sputum porduction. Gi: Denies swallowing difficulty,  Gu:  Denies bladder incontinence, burning urine Ext:   No Joint pain, stiffness. Skin: No skin rash, easy bruising. Endoc:  No polyuria, polydipsia. Psych: No depression, insomnia. Other:  All other systems were reviewed and found to be negative other than what is mentioned in the HPI.   Physical Examination:   VS: BP 100/60 (BP Location: Left Arm, Cuff Size: Large)   Pulse 60   Resp 16   Ht 6\' 1"  (1.854 m)   Wt 238 lb (108 kg)   SpO2 97%   BMI 31.40 kg/m   General Appearance: No distress  Neuro:without focal findings,  speech normal,  HEENT: PERRLA, EOM intact.  Mallampati 2 Pulmonary: normal breath sounds, No wheezing.   CardiovascularNormal S1,S2.  No m/r/g.   Abdomen: Benign, Soft, non-tender. Renal:  No costovertebral tenderness  GU:  Not performed at this time. Endoc: No evident thyromegaly, no signs of acromegaly. Skin:   warm, no rash. Extremities: normal, no cyanosis, clubbing.   LABORATORY PANEL:   CBC No results for input(s): WBC, HGB, HCT, PLT in the last 168 hours. ------------------------------------------------------------------------------------------------------------------  Chemistries  No results for input(s): NA, K, CL, CO2, GLUCOSE, BUN, CREATININE, CALCIUM, MG, AST, ALT, ALKPHOS, BILITOT in the last 168 hours.  Invalid input(s): GFRCGP ------------------------------------------------------------------------------------------------------------------  Cardiac Enzymes No results for input(s): TROPONINI in the last 168 hours. ------------------------------------------------------------  RADIOLOGY:   No results found for this or any previous visit. Results for orders placed during the hospital encounter of 07/04/15  DG Chest 2 View   Narrative CLINICAL DATA:  Left chest pain and pressure. Nausea and lightheadedness.  EXAM: CHEST  2 VIEW  COMPARISON:   None.  FINDINGS: The heart size and mediastinal contours are within normal limits. Both lungs are clear. The visualized skeletal structures are unremarkable.  IMPRESSION: No active cardiopulmonary disease.   Electronically Signed   By: Van Clines M.D.   On: 07/04/2015 17:39    ------------------------------------------------------------------------------------------------------------------  Thank  you for allowing [facility] to assist in the care of your patient. Our recommendations are noted above.  Please contact us if we can be of further service.   Marda Stalker, MD.  [facility]  Patricia Pesa, M.D.  Merton Border, M.D

## 2017-09-03 NOTE — Telephone Encounter (Signed)
LMOVM asking how he is doing and if Sx improving/also advised that he has been put on a waiting list for Neurology to try and get him in sooner/asked that he RTC to give [place] an update/thx dmf

## 2017-09-03 NOTE — Telephone Encounter (Signed)
I contacted [facility] in reference to patient appointment scheduled in March to see if patient can be seen sooner. Per the office that is the soonest appointment.patient has been placed on wait list for a sooner appointment. If patient needs to be sooner, I was advised that Dr. Deborra Medina can send a message to Dr. Narda Amber @ [facility] about patient and this may help with getting the patient in sooner. Please let me know your thoughts about this referral.

## 2017-09-06 ENCOUNTER — Encounter: Payer: Self-pay | Admitting: Internal Medicine

## 2017-09-06 ENCOUNTER — Ambulatory Visit: Payer: Federal, State, Local not specified - PPO | Admitting: Internal Medicine

## 2017-09-06 VITALS — BP 100/60 | HR 60 | Resp 16 | Ht 73.0 in | Wt 238.0 lb

## 2017-09-06 DIAGNOSIS — G4719 Other hypersomnia: Secondary | ICD-10-CM | POA: Diagnosis not present

## 2017-09-06 DIAGNOSIS — G4733 Obstructive sleep apnea (adult) (pediatric): Secondary | ICD-10-CM

## 2017-09-06 NOTE — Patient Instructions (Signed)
Will send for Auto-CPAP with pressure range of 5-20 cm H2O.     Sleep Apnea Sleep apnea is disorder that affects a person's sleep. A person with sleep apnea has abnormal pauses in their breathing when they sleep. It is hard for them to get a good sleep. This makes a person tired during the day. It also can lead to other physical problems. There are three types of sleep apnea. One type is when breathing stops for a short time because your airway is blocked (obstructive sleep apnea). Another type is when the brain sometimes fails to give the normal signal to breathe to the muscles that control your breathing (central sleep apnea). The third type is a combination of the other two types. HOME CARE   Take all medicine as told by your doctor.  Avoid alcohol, calming medicines (sedatives), and depressant drugs.  Try to lose weight if you are overweight. Talk to your doctor about a healthy weight goal.  Your doctor may have you use a device that helps to open your airway. It can help you get the air that you need. It is called a positive airway pressure (PAP) device.   MAKE SURE YOU:   Understand these instructions.  Will watch your condition.  Will get help right away if you are not doing well or get worse.  It may take approximately 1 month for you to get used to wearing her CPAP every night.  Be sure to work with your machine to get used to it, be patient, it may take time!

## 2017-09-15 ENCOUNTER — Telehealth: Payer: Self-pay | Admitting: *Deleted

## 2017-09-15 MED ORDER — BUSPIRONE HCL 30 MG PO TABS
15.0000 mg | ORAL_TABLET | Freq: Two times a day (BID) | ORAL | 0 refills | Status: DC
Start: 2017-09-15 — End: 2017-09-16

## 2017-09-15 NOTE — Telephone Encounter (Signed)
Rx sent 

## 2017-09-16 ENCOUNTER — Telehealth: Payer: Self-pay | Admitting: *Deleted

## 2017-09-16 ENCOUNTER — Other Ambulatory Visit: Payer: Self-pay

## 2017-09-16 MED ORDER — BUSPIRONE HCL 30 MG PO TABS: 15.0000 mg | ORAL_TABLET | Freq: Two times a day (BID) | ORAL | 1 refills | Status: DC

## 2017-09-16 NOTE — Telephone Encounter (Signed)
Attempted to contact patient in reference to cpap set up. Voice mail box full.

## 2017-09-16 NOTE — Telephone Encounter (Signed)
Spoke with patient's emergency contact and gave information for patient to contact [facility] for CPAP set-up.

## 2017-09-16 NOTE — Telephone Encounter (Signed)
Good morning.  Wanted to let you know that my RT team spoke with this patient on 12/20 in an effort to schedule his CPAP set up. At that time, he advised that he would call us back to schedule. My RT team attempted to reach him again on 12/21 and 12/26 with no success.  If the patient wishes to move forward, he can reach RT at [phone] ext [phone].   Thank you!  Melissa

## 2017-09-22 DIAGNOSIS — G4733 Obstructive sleep apnea (adult) (pediatric): Secondary | ICD-10-CM | POA: Diagnosis not present

## 2017-09-24 ENCOUNTER — Other Ambulatory Visit: Payer: Self-pay

## 2017-09-24 MED ORDER — FLUOXETINE HCL 20 MG PO TABS: 20.0000 mg | ORAL_TABLET | Freq: Every day | ORAL | 2 refills | Status: DC

## 2017-09-24 MED ORDER — ROSUVASTATIN CALCIUM 40 MG PO TABS: 40.0000 mg | ORAL_TABLET | Freq: Every day | ORAL | 2 refills | Status: DC

## 2017-10-21 ENCOUNTER — Encounter: Payer: Self-pay | Admitting: Internal Medicine

## 2017-10-23 DIAGNOSIS — G4733 Obstructive sleep apnea (adult) (pediatric): Secondary | ICD-10-CM | POA: Diagnosis not present

## 2017-10-27 DIAGNOSIS — K080 Exfoliation of teeth due to systemic causes: Secondary | ICD-10-CM | POA: Diagnosis not present

## 2017-11-20 DIAGNOSIS — G4733 Obstructive sleep apnea (adult) (pediatric): Secondary | ICD-10-CM | POA: Diagnosis not present

## 2017-11-24 ENCOUNTER — Ambulatory Visit: Payer: Federal, State, Local not specified - PPO | Admitting: Internal Medicine

## 2017-12-01 ENCOUNTER — Encounter: Payer: Self-pay | Admitting: Neurology

## 2017-12-01 ENCOUNTER — Ambulatory Visit: Payer: Federal, State, Local not specified - PPO | Admitting: Neurology

## 2017-12-01 ENCOUNTER — Encounter: Payer: Self-pay | Admitting: Internal Medicine

## 2017-12-01 ENCOUNTER — Ambulatory Visit: Payer: Federal, State, Local not specified - PPO | Admitting: Internal Medicine

## 2017-12-01 VITALS — BP 122/80 | HR 66 | Resp 16 | Ht 72.0 in | Wt 247.0 lb

## 2017-12-01 VITALS — BP 100/68 | HR 61 | Ht 73.0 in | Wt 248.1 lb

## 2017-12-01 DIAGNOSIS — G4733 Obstructive sleep apnea (adult) (pediatric): Secondary | ICD-10-CM

## 2017-12-01 DIAGNOSIS — G5713 Meralgia paresthetica, bilateral lower limbs: Secondary | ICD-10-CM | POA: Diagnosis not present

## 2017-12-01 NOTE — Patient Instructions (Signed)
Encouraged to loose weight  Avoid tight fitting clothing at the waist  Return to clinic as needed

## 2017-12-01 NOTE — Progress Notes (Signed)
* [facility]     Assessment and Plan:  Obstructive sleep apnea. -Home sleep study showed an AHI of 20. -Doing well with CPAP, will adjust pressure to 8-20.  Shiftwork sleep disorder. -Patient works variable shifts, likely contributing to his daytime sleepiness. -Daytime sleepiness improved with CPAP.  Insomnia.  -Currently controlled with Ambien.  Anxiety/Depression.  -Currently on fluoxetine.  Orders Placed This Encounter  Procedures  . Ambulatory Referral for DME     Date: 12/01/2017  MRN# [id number] IRA DOUGHER 14-Jul-1961   ANN GROENEVELD is a 57 y.o. old male seen in follow up for chief complaint of  Chief Complaint  Patient presents with  . Sleep Apnea    Pt states cpap therapy going well when he uses it.   HPI:   The patient is a 57 year old male, who was previously seen with complaints of excessive daytime sleepiness, suspicious for sleep apnea.  He has been restarted on CPAP and is doing well with it, he is using it every night for about 7 hours, he is feeling more rested and less sleepy during the day.   **Review of download data 30 days as of 11/29/17.  Usage greater than 4 hours is 28/30 days.  Average usage on days used 7 hours 2 minutes.  Set pressure is 5-20.  95th percentile pressure is 11, median pressure is 9, maximum pressure is 13.  Residual AHI is 2.3.  -Sleep study and tracings from home test 10/17/2015 were reviewed, this showed an AHI of 20  Medication:   Outpatient Encounter Medications as of 12/01/2017  Medication Sig  . aspirin 81 MG tablet Take 81 mg by mouth daily.  . busPIRone (BUSPAR) 30 MG tablet Take 0.5 tablets (15 mg total) by mouth 2 (two) times daily.  Marland Kitchen FLUoxetine (PROZAC) 20 MG tablet Take 1 tablet (20 mg total) by mouth daily.  . hydrOXYzine (ATARAX/VISTARIL) 25 MG tablet TAKE 1 TABLET BY MOUTH AT BEDTIME FOR ITCHING  . ibuprofen (ADVIL,MOTRIN) 800 MG tablet Take 1 tablet (800 mg total) by mouth every  8 (eight) hours as needed.  . meloxicam (MOBIC) 15 MG tablet TAKE 1 TABLET (15 MG TOTAL) BY MOUTH DAILY.  . metaxalone (SKELAXIN) 800 MG tablet Take 1 tablet (800 mg total) by mouth 3 (three) times daily.  . metoprolol tartrate (LOPRESSOR) 50 MG tablet Take 1 tablet (50 mg total) by mouth 2 (two) times daily.  Marland Kitchen olmesartan (BENICAR) 20 MG tablet Take 1 tablet (20 mg total) by mouth daily.  Marland Kitchen omeprazole (PRILOSEC) 20 MG capsule Take 1 capsule (20 mg total) by mouth daily as needed.  . ranitidine (ZANTAC) 150 MG capsule Take 1 capsule (150 mg total) by mouth 2 (two) times daily as needed for heartburn.  . rosuvastatin (CRESTOR) 40 MG tablet Take 1 tablet (40 mg total) by mouth at bedtime.  Marland Kitchen scopolamine (TRANSDERM-SCOP, 1.5 MG,) 1 MG/3DAYS Place 1 patch (1.5 mg total) onto the skin every 3 (three) days.  Marland Kitchen zolpidem (AMBIEN) 10 MG tablet Take 1 tablet (10 mg total) by mouth at bedtime.   No facility-administered encounter medications on file as of 12/01/2017.      Allergies:  Lisinopril  Review of Systems: Gen:  Denies  fever, sweats. HEENT: Denies blurred vision. Cvc:  No dizziness, chest pain or heaviness Resp:   Denies cough or sputum porduction. Gi: Denies swallowing difficulty,  Gu:  Denies bladder incontinence, burning urine Ext:   No Joint pain, stiffness. Skin: No skin  rash, easy bruising. Endoc:  No polyuria, polydipsia. Psych: No depression, insomnia. Other:  All other systems were reviewed and found to be negative other than what is mentioned in the HPI.   Physical Examination:   VS: BP 122/80 (BP Location: Left Arm, Cuff Size: Large)   Pulse 66   Resp 16   Ht 6' (1.829 m)   Wt 247 lb (112 kg)   SpO2 95%   BMI 33.50 kg/m   General Appearance: No distress  Neuro:without focal findings,  speech normal,  HEENT: PERRLA, EOM intact.  Mallampati 2 Pulmonary: normal breath sounds, No wheezing.   CardiovascularNormal S1,S2.  No m/r/g.   Abdomen: Benign, Soft,  non-tender. Renal:  No costovertebral tenderness  GU:  Not performed at this time. Endoc: No evident thyromegaly, no signs of acromegaly. Skin:   warm, no rash. Extremities: normal, no cyanosis, clubbing.   LABORATORY PANEL:   CBC No results for input(s): WBC, HGB, HCT, PLT in the last 168 hours. ------------------------------------------------------------------------------------------------------------------  Chemistries  No results for input(s): NA, K, CL, CO2, GLUCOSE, BUN, CREATININE, CALCIUM, MG, AST, ALT, ALKPHOS, BILITOT in the last 168 hours.  Invalid input(s): GFRCGP ------------------------------------------------------------------------------------------------------------------  Cardiac Enzymes No results for input(s): TROPONINI in the last 168 hours. ------------------------------------------------------------  RADIOLOGY:   No results found for this or any previous visit. Results for orders placed during the hospital encounter of 07/04/15  DG Chest 2 View   Narrative CLINICAL DATA:  Left chest pain and pressure. Nausea and lightheadedness.  EXAM: CHEST  2 VIEW  COMPARISON:  None.  FINDINGS: The heart size and mediastinal contours are within normal limits. Both lungs are clear. The visualized skeletal structures are unremarkable.  IMPRESSION: No active cardiopulmonary disease.   Electronically Signed   By: Van Clines M.D.   On: 07/04/2015 17:39    ------------------------------------------------------------------------------------------------------------------  Thank  you for allowing [facility] to assist in the care of your patient. Our recommendations are noted above.  Please contact us if we can be of further service.   Marda Stalker, MD.  [facility]  Patricia Pesa, M.D.  Merton Border, M.D

## 2017-12-01 NOTE — Patient Instructions (Signed)
Continue using your CPAP every night.  Will narrow pressure to 8-15 cm.

## 2017-12-01 NOTE — Progress Notes (Signed)
Joseph Church   Date: 12/01/17  EDOARDO Church MRN: [id number] DOB: 1961/06/20   Dear Dr. Deborra Medina:  Thank you for your kind referral of Joseph Church for consultation of bilateral thigh numbness. Although his history is well known to you, please allow [place] to reiterate it for the purpose of our medical record. The patient was accompanied to the clinic by self.   History of Present Illness: Joseph Church is a 57 y.o. right-handed Caucasian male with hyperlipidemia, hypertension, anxiety, and GERD presenting for evaluation of thigh numbness.    Starting about early 2018, he started noticing numbness over the right lateral and anterior thigh, which is worse with standing for > 5 minutes.  A few months later, he developed the same symptoms in the left thigh, but to a lesser degree.   Walking can quickly alleviate his discomfort.  He denies low back pain or leg weakness.  He also has a rash over the right buttocks, which did not becomes vesicles.  He has gained about 10lb over the past year. He denies wearing tight constrictive clothing around the waist.   He drinks 2 drinks nightly.  He has no history of diabetes.     Out-side paper records, electronic medical record, and images have been reviewed where available and summarized as:  Lab Results  Component Value Date   TSH 2.98 07/11/2015     Past Medical History:  Diagnosis Date  . GERD (gastroesophageal reflux disease)   . Hyperlipidemia   . Hypertension     Past Surgical History:  Procedure Laterality Date  . [facility];[place]  . CHOLECYSTECTOMY, LAPAROSCOPIC  2000  . keratotomy  1995  . TONSILLECTOMY AND ADENOIDECTOMY  1960's  . VASECTOMY  1987  . VASECTOMY REVERSAL  1994     Medications:  Outpatient Encounter Medications as of 12/01/2017  Medication Sig  . aspirin 81 MG tablet Take 81 mg by mouth daily.    . busPIRone (BUSPAR) 30 MG tablet Take 0.5 tablets (15 mg total) by mouth 2 (two) times daily.  Marland Kitchen FLUoxetine (PROZAC) 20 MG tablet Take 1 tablet (20 mg total) by mouth daily.  . hydrOXYzine (ATARAX/VISTARIL) 25 MG tablet TAKE 1 TABLET BY MOUTH AT BEDTIME FOR ITCHING  . ibuprofen (ADVIL,MOTRIN) 800 MG tablet Take 1 tablet (800 mg total) by mouth every 8 (eight) hours as needed.  . meloxicam (MOBIC) 15 MG tablet TAKE 1 TABLET (15 MG TOTAL) BY MOUTH DAILY.  . metaxalone (SKELAXIN) 800 MG tablet Take 1 tablet (800 mg total) by mouth 3 (three) times daily.  . metoprolol tartrate (LOPRESSOR) 50 MG tablet Take 1 tablet (50 mg total) by mouth 2 (two) times daily.  Marland Kitchen olmesartan (BENICAR) 20 MG tablet Take 1 tablet (20 mg total) by mouth daily.  Marland Kitchen omeprazole (PRILOSEC) 20 MG capsule Take 1 capsule (20 mg total) by mouth daily as needed.  . ranitidine (ZANTAC) 150 MG capsule Take 1 capsule (150 mg total) by mouth 2 (two) times daily as needed for heartburn.  . rosuvastatin (CRESTOR) 40 MG tablet Take 1 tablet (40 mg total) by mouth at bedtime.  Marland Kitchen scopolamine (TRANSDERM-SCOP, 1.5 MG,) 1 MG/3DAYS Place 1 patch (1.5 mg total) onto the skin every 3 (three) days.  Marland Kitchen zolpidem (AMBIEN) 10 MG tablet Take 1 tablet (10 mg total) by mouth at bedtime.   No facility-administered encounter medications on file as of 12/01/2017.  Allergies:  Allergies  Allergen Reactions  . Lisinopril Cough    Family History: Family History  Problem Relation Age of Onset  . Cancer Mother   . Cancer Father        lymphoma  . Hypertension Father   . Hyperlipidemia Father   . Heart disease Father 67       CABG x 4  Mother passed away at the age of 43 breast cancer. Brother is healthy He has five children.  His eldest daughter has seizures.   Social History: Social History   Tobacco Use  . Smoking status: Never Smoker  . Smokeless tobacco: Never Used  Substance Use Topics  . Alcohol use: Yes    Alcohol/week: 5.0  oz    Types: 10 Standard drinks or equivalent per week    Comment: daily  . Drug use: No   Social History   Social History Narrative   67 [address]   Divorced 3 times   Has 5 children- two teenagers that live in [place]    Review of Systems:  CONSTITUTIONAL: No fevers, chills, night sweats, or weight loss.   EYES: No visual changes or eye pain ENT: No hearing changes.  No history of nose bleeds.   RESPIRATORY: No cough, wheezing and shortness of breath.   CARDIOVASCULAR: Negative for chest pain, and palpitations.   GI: Negative for abdominal discomfort, blood in stools or black stools.  No recent change in bowel habits.   GU:  No history of incontinence.   MUSCLOSKELETAL: No history of joint pain or swelling.  No myalgias.   SKIN: Negative for lesions, rash, and itching.   HEMATOLOGY/ONCOLOGY: Negative for prolonged bleeding, bruising easily, and swollen nodes.  No history of cancer.   ENDOCRINE: Negative for cold or heat intolerance, polydipsia or goiter.   PSYCH:  No depression or anxiety symptoms.   NEURO: As Above.   Vital Signs:  BP 100/68   Pulse 61   Ht 6\' 1"  (1.854 m)   Wt 248 lb 2 oz (112.5 kg)   SpO2 96%   BMI 32.74 kg/m  Pain Scale: 0 on a scale of 0-10   General Medical Exam:   General:  Well appearing, comfortable.   Eyes/ENT: see cranial nerve examination.   Neck: No masses appreciated.  Full range of motion without tenderness.  No carotid bruits. Respiratory:  Clear to auscultation, good air entry bilaterally.   Cardiac:  Regular rate and rhythm, no murmur.   Extremities:  No deformities, edema, or skin discoloration.  Skin:  No rashes or lesions.  Neurological Exam: MENTAL STATUS including orientation to time, place, person, recent and remote memory, attention span and concentration, language, and fund of knowledge is normal.  Speech is not dysarthric.  CRANIAL NERVES: II:  No visual field defects.  Unremarkable fundi.   III-IV-VI:  Pupils equal round and reactive to light.  Normal conjugate, extra-ocular eye movements in all directions of gaze.  No nystagmus.  No ptosis.   V:  Normal facial sensation.     VII:  Normal facial symmetry and movements.    VIII:  Normal hearing and vestibular function.   IX-X:  Normal palatal movement.   XI:  Normal shoulder shrug and head rotation.   XII:  Normal tongue strength and range of motion, no deviation or fasciculation.  MOTOR:  No atrophy, fasciculations or abnormal movements.  No pronator drift.  Tone is normal.    Right Upper Extremity:    Left  Upper Extremity:    Deltoid  5/5   Deltoid  5/5   Biceps  5/5   Biceps  5/5   Triceps  5/5   Triceps  5/5   Wrist extensors  5/5   Wrist extensors  5/5   Wrist flexors  5/5   Wrist flexors  5/5   Finger extensors  5/5   Finger extensors  5/5   Finger flexors  5/5   Finger flexors  5/5   Dorsal interossei  5/5   Dorsal interossei  5/5   Abductor pollicis  5/5   Abductor pollicis  5/5   Tone (Ashworth scale)  0  Tone (Ashworth scale)  0   Right Lower Extremity:    Left Lower Extremity:    Hip flexors  5/5   Hip flexors  5/5   Hip extensors  5/5   Hip extensors  5/5   Knee flexors  5/5   Knee flexors  5/5   Knee extensors  5/5   Knee extensors  5/5   Dorsiflexors  5/5   Dorsiflexors  5/5   Plantarflexors  5/5   Plantarflexors  5/5   Toe extensors  5/5   Toe extensors  5/5   Toe flexors  5/5   Toe flexors  5/5   Tone (Ashworth scale)  0  Tone (Ashworth scale)  0   MSRs:  Right                                                                 Left brachioradialis 2+  brachioradialis 2+  biceps 2+  biceps 2+  triceps 2+  triceps 2+  patellar 2+  patellar 2+  ankle jerk 2+  ankle jerk 2+  Hoffman no  Hoffman no  plantar response down  plantar response down   SENSORY:  Reduced pin prick and temperature over the lateral femoral cutaneous nerve distribution bilaterally, worse on the right.  Otherwise, normal and symmetric  perception of light touch, pinprick, vibration, and proprioception.  Romberg's sign absent.   COORDINATION/GAIT: Normal finger-to- nose-finger and heel-to-shin.  Intact rapid alternating movements bilaterally.  Able to rise from a chair without using arms.  Gait narrow based and stable. Stressed gait intact, mild unsteadiness with tandem gait.   IMPRESSION: Mr. Arpino is a 57 year-old man referred for evaluation of bilateral thigh paresthesias.  His examination is consistent with diminished sensation in all modalities over the anterolateral thigh on the right >> left side. Based on history of exam, he has meralgia paresthetica an entrapment of the lateral femoral cutaneous nerve as it exits below the inguinal canal. Management is conservative with NSAIDs and avoidance of repetitive trauma to this region.  If he develops painful paresthesias, start OTC lidocaine ointment.  Given that symptoms are primarily numbness, there is no effective analgesic.  Recommend weight loss and avoid tight fitting clothing.  Return to clinic as needed   Thank you for allowing me to participate in patient's care.  If I can answer any additional questions, I would be pleased to do so.    Sincerely,    Donika K. Posey Pronto, DO

## 2017-12-21 DIAGNOSIS — G4733 Obstructive sleep apnea (adult) (pediatric): Secondary | ICD-10-CM | POA: Diagnosis not present

## 2017-12-30 ENCOUNTER — Other Ambulatory Visit: Payer: Self-pay | Admitting: Family Medicine

## 2017-12-31 NOTE — Telephone Encounter (Signed)
Rx last filled 10/04/17

## 2018-01-03 DIAGNOSIS — G4733 Obstructive sleep apnea (adult) (pediatric): Secondary | ICD-10-CM | POA: Diagnosis not present

## 2018-01-20 ENCOUNTER — Other Ambulatory Visit: Payer: Self-pay | Admitting: Family Medicine

## 2018-01-20 DIAGNOSIS — G4733 Obstructive sleep apnea (adult) (pediatric): Secondary | ICD-10-CM | POA: Diagnosis not present

## 2018-02-16 ENCOUNTER — Other Ambulatory Visit: Payer: Self-pay | Admitting: Family Medicine

## 2018-02-20 DIAGNOSIS — G4733 Obstructive sleep apnea (adult) (pediatric): Secondary | ICD-10-CM | POA: Diagnosis not present

## 2018-02-21 ENCOUNTER — Ambulatory Visit: Payer: Federal, State, Local not specified - PPO | Admitting: Family Medicine

## 2018-02-21 ENCOUNTER — Ambulatory Visit: Payer: Federal, State, Local not specified - PPO

## 2018-02-21 ENCOUNTER — Telehealth: Payer: Self-pay | Admitting: Family Medicine

## 2018-02-21 ENCOUNTER — Encounter: Payer: Self-pay | Admitting: Family Medicine

## 2018-02-21 ENCOUNTER — Ambulatory Visit (INDEPENDENT_AMBULATORY_CARE_PROVIDER_SITE_OTHER): Payer: Federal, State, Local not specified - PPO

## 2018-02-21 VITALS — BP 110/80 | HR 62

## 2018-02-21 DIAGNOSIS — M79644 Pain in right finger(s): Secondary | ICD-10-CM

## 2018-02-21 DIAGNOSIS — S6991XA Unspecified injury of right wrist, hand and finger(s), initial encounter: Secondary | ICD-10-CM | POA: Diagnosis not present

## 2018-02-21 NOTE — Patient Instructions (Signed)
We'll let you know xray results when they return.

## 2018-02-21 NOTE — Progress Notes (Signed)
Joseph Church - 57 y.o. male MRN [id number]  Date of birth: [birth year]  Subjective Chief Complaint  Patient presents with  . Finger Injury    right thumb     HPI Joseph Church is a 57 y.o. male here today for same day visit with complaint of R thumb pain.  Reports that he was participating in a mud run over the weekend and bent thumb backwards going down a slide.  Pain located on lateral aspect of thumb.  Does have some significant bruising as well extending to thenar eminence.  Denies numbness, tingling.  Has not tried anything for treatment.   ROS:  ROS completed and negative except as noted per HPI Allergies  Allergen Reactions  . Lisinopril Cough    Past Medical History:  Diagnosis Date  . GERD (gastroesophageal reflux disease)   . Hyperlipidemia   . Hypertension     Past Surgical History:  Procedure Laterality Date  . [place]  . CHOLECYSTECTOMY, LAPAROSCOPIC  2000  . keratotomy  1995  . TONSILLECTOMY AND ADENOIDECTOMY  1960's  . VASECTOMY  1987  . VASECTOMY REVERSAL  1994    Social History   Socioeconomic History  . Marital status: Married    Spouse name: Not on file  . Number of children: 5  . Years of education: Not on file  . Highest education level: Not on file  Occupational History  . Occupation: PA  Social Needs  . Financial resource strain: Not on file  . Food insecurity:    Worry: Not on file    Inability: Not on file  . Transportation needs:    Medical: Not on file    Non-medical: Not on file  Tobacco Use  . Smoking status: Never Smoker  . Smokeless tobacco: Never Used  Substance and Sexual Activity  . Alcohol use: Yes    Alcohol/week: 5.0 oz    Types: 10 Standard drinks or equivalent per week    Comment: daily  . Drug use: No  . Sexual activity: Not on file  Lifestyle  . Physical activity:    Days per week: Not on file    Minutes per session: Not on file  .  Stress: Not on file  Relationships  . Social connections:    Talks on phone: Not on file    Gets together: Not on file    Attends religious service: Not on file    Active member of club or organization: Not on file    Attends meetings of clubs or organizations: Not on file    Relationship status: Not on file  Other Topics Concern  . Not on file  Social History Narrative   3 [place]   Divorced 3 times   Has 5 children- two teenagers that live in [place]    Family History  Problem Relation Age of Onset  . Cancer Mother   . Cancer Father        lymphoma  . Hypertension Father   . Hyperlipidemia Father   . Heart disease Father 86       CABG x 4    Health Maintenance  Topic Date Due  . HIV Screening  07/11/2019 (Originally 07/19/1975)  . INFLUENZA VACCINE  04/21/2018  . COLONOSCOPY  11/07/2020  . TETANUS/TDAP  08/12/2026  . Hepatitis C Screening  Completed    ----------------------------------------------------------------------------------------------------------------------------------------------------------------------------------------------------------------- Physical Exam BP 110/80   Pulse 62   Physical Exam  Constitutional: He is oriented to person, place, and time. He appears well-nourished. No distress.  Musculoskeletal:  Base of right thumb with ttp along radial aspect.  Bruising noted along thenar eminence and base of thumb.  ROM is fairly good, unable to touch thumb to fifth digit.  No tenderness along ulnar aspect, no laxity of ulnar collateral ligament.   Neurological: He is alert and oriented to person, place, and time.  Psychiatric: He has a normal mood and affect. His behavior is normal.    ------------------------------------------------------------------------------------------------------------------------------------------------------------------------------------------------------------------- Assessment and Plan  Injury of  right thumb Suspect fracture of Right thumb Xrays ordered.  If fracture noted will refer to hand specialist.

## 2018-02-21 NOTE — Telephone Encounter (Signed)
Call pt to offer an appt (no appt at [place] open today). Pt was wondering if Dr. Deborra Medina can order x-ray at [place] location without seeing him first. 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     Copied from [place] [id number]. Topic: General - Other >> Feb 21, 2018  8:55 AM Carolyn Stare wrote:  Pt fell and right thumb is swollen and bruise and is asking if he can go to [facility] for an [employer]

## 2018-02-21 NOTE — Telephone Encounter (Signed)
Can we get him in with Dr. Raeford Razor or Dr. Lorelei Pont?  I doubt [facility] will allow me to order an xray there without having an appointment with me or another provider.

## 2018-02-21 NOTE — Telephone Encounter (Signed)
appt is set with Dr. Zigmund Daniel today.

## 2018-02-21 NOTE — Assessment & Plan Note (Signed)
Suspect fracture of Right thumb Xrays ordered.  If fracture noted will refer to hand specialist.

## 2018-03-14 ENCOUNTER — Encounter: Payer: Self-pay | Admitting: Family Medicine

## 2018-03-16 ENCOUNTER — Other Ambulatory Visit: Payer: Self-pay | Admitting: Family Medicine

## 2018-03-18 NOTE — Progress Notes (Addendum)
Joseph Church - 57 y.o. male MRN [id number]  Date of birth: October 19, 1960  SUBJECTIVE:  Including CC & ROS.  Chief Complaint  Patient presents with  . Thumb pain    Joseph Church is a 57 y.o. male that is presenting right thumb pain. He slipped while he was running in a mud run and hyperextended his thumb. He felt a pop and severe pain. Admits to swelling and tenderness. Mild to severe pain when he extends and flexes his thumb. Having difficulty grasping objects. He has not taken anything for the pain. He has his initial injury 4 weeks ago and has not had any improvement. Has not been in a splint or any other brace.   Independent review of the thumb x-ray from 6/3 is a normal exam.   Review of Systems  Constitutional: Negative for fever.  HENT: Negative for congestion.   Respiratory: Negative for cough.   Cardiovascular: Negative for chest pain.  Gastrointestinal: Negative for abdominal pain.  Musculoskeletal: Negative for gait problem.  Skin: Negative for color change.  Neurological: Negative for weakness.  Hematological: Negative for adenopathy.  Psychiatric/Behavioral: Negative for agitation.    HISTORY: Past Medical, Surgical, Social, and Family History Reviewed & Updated per EMR.   Pertinent Historical Findings include:  Past Medical History:  Diagnosis Date  . GERD (gastroesophageal reflux disease)   . Hyperlipidemia   . Hypertension     Past Surgical History:  Procedure Laterality Date  . [place]  . CHOLECYSTECTOMY, LAPAROSCOPIC  2000  . keratotomy  1995  . TONSILLECTOMY AND ADENOIDECTOMY  1960's  . VASECTOMY  1987  . VASECTOMY REVERSAL  1994    Allergies  Allergen Reactions  . Lisinopril Cough    Family History  Problem Relation Age of Onset  . Cancer Mother   . Cancer Father        lymphoma  . Hypertension Father   . Hyperlipidemia Father   . Heart disease Father 47       CABG x  4     Social History   Socioeconomic History  . Marital status: Married    Spouse name: Not on file  . Number of children: 5  . Years of education: Not on file  . Highest education level: Not on file  Occupational History  . Occupation: PA  Social Needs  . Financial resource strain: Not on file  . Food insecurity:    Worry: Not on file    Inability: Not on file  . Transportation needs:    Medical: Not on file    Non-medical: Not on file  Tobacco Use  . Smoking status: Never Smoker  . Smokeless tobacco: Never Used  Substance and Sexual Activity  . Alcohol use: Yes    Alcohol/week: 6.0 oz    Types: 10 Standard drinks or equivalent per week  . Drug use: No  . Sexual activity: Not on file  Lifestyle  . Physical activity:    Days per week: Not on file    Minutes per session: Not on file  . Stress: Not on file  Relationships  . Social connections:    Talks on phone: Not on file    Gets together: Not on file    Attends religious service: Not on file    Active member of club or organization: Not on file    Attends meetings of clubs or organizations: Not on file  Relationship status: Not on file  . Intimate partner violence:    Fear of current or ex partner: Not on file    Emotionally abused: Not on file    Physically abused: Not on file    Forced sexual activity: Not on file  Other Topics Concern  . Not on file  Social History Narrative   2 [place]   Divorced 3 times   Has 5 children- two teenagers that live in [place]:  VS: BP 136/74 (BP Location: Left Arm, Patient Position: Sitting, Cuff Size: Normal)   Pulse 68   Ht 6' (1.829 m)   Wt 250 lb (113.4 kg)   SpO2 100%   BMI 33.91 kg/m  Physical Exam Gen: NAD, alert, cooperative with exam, well-appearing ENT: normal lips, normal nasal mucosa,  Eye: normal EOM, normal conjunctiva and lids CV:  no edema, +2 pedal pulses   Resp: no accessory muscle use, non-labored,    Skin: no rashes, no areas of induration  Neuro: normal tone, normal sensation to touch Psych:  normal insight, alert and oriented MSK:  Right thumb:  TTP of the MP joint on the radial side  Laxity with stress testing  Normal flexion and extension  No snuffbox tenderness  No ecchymosis  Normal Finkelstein's test  Neurovascularly intact.   Limited ultrasound: right thumb:  Normal appearing scaphoid  Normal appearing 1st dorsal compartment.  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 US revealed laxity with the ulnar sided ligament.  - will give a thumb spica splint  - MR thumb to evaluate UCL

## 2018-03-21 ENCOUNTER — Ambulatory Visit (INDEPENDENT_AMBULATORY_CARE_PROVIDER_SITE_OTHER): Payer: Federal, State, Local not specified - PPO

## 2018-03-21 ENCOUNTER — Encounter: Payer: Self-pay | Admitting: Family Medicine

## 2018-03-21 ENCOUNTER — Ambulatory Visit: Payer: Federal, State, Local not specified - PPO | Admitting: Family Medicine

## 2018-03-21 VITALS — BP 136/74 | HR 68 | Ht 72.0 in | Wt 250.0 lb

## 2018-03-21 DIAGNOSIS — S6991XA Unspecified injury of right wrist, hand and finger(s), initial encounter: Secondary | ICD-10-CM

## 2018-03-21 NOTE — Patient Instructions (Signed)
Nice to meet you  I will call you once the splint gets here

## 2018-03-21 NOTE — Assessment & Plan Note (Addendum)
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 US revealed laxity with the ulnar sided ligament.  - will give a thumb spica splint  - MR thumb to evaluate UCL

## 2018-03-22 DIAGNOSIS — G4733 Obstructive sleep apnea (adult) (pediatric): Secondary | ICD-10-CM | POA: Diagnosis not present

## 2018-03-23 NOTE — Addendum Note (Signed)
Addended by: Verlene Mayer A on: 03/23/2018 09:02 AM   Modules accepted: Orders

## 2018-03-25 ENCOUNTER — Encounter: Payer: Self-pay | Admitting: Family Medicine

## 2018-03-26 ENCOUNTER — Emergency Department
Admission: EM | Admit: 2018-03-26 | Discharge: 2018-03-27 | Disposition: A | Discharge status: Home / Self Care | Payer: Federal, State, Local not specified - PPO | Attending: Emergency Medicine | Admitting: Emergency Medicine

## 2018-03-26 ENCOUNTER — Encounter: Payer: Self-pay | Admitting: Emergency Medicine

## 2018-03-26 DIAGNOSIS — R112 Nausea with vomiting, unspecified: Secondary | ICD-10-CM | POA: Insufficient documentation

## 2018-03-26 DIAGNOSIS — R197 Diarrhea, unspecified: Secondary | ICD-10-CM | POA: Diagnosis not present

## 2018-03-26 DIAGNOSIS — I10 Essential (primary) hypertension: Secondary | ICD-10-CM | POA: Insufficient documentation

## 2018-03-26 DIAGNOSIS — Z79899 Other long term (current) drug therapy: Secondary | ICD-10-CM | POA: Insufficient documentation

## 2018-03-26 LAB — URINALYSIS, COMPLETE (UACMP) WITH MICROSCOPIC
BILIRUBIN URINE: NEGATIVE
Bacteria, UA: NONE SEEN
GLUCOSE, UA: NEGATIVE mg/dL
HGB URINE DIPSTICK: NEGATIVE
KETONES UR: NEGATIVE mg/dL
LEUKOCYTES UA: NEGATIVE
Nitrite: NEGATIVE
PH: 5 (ref 5.0–8.0)
PROTEIN: 30 mg/dL — AB
Specific Gravity, Urine: 1.031 — ABNORMAL HIGH (ref 1.005–1.030)

## 2018-03-26 LAB — CBC
HCT: 46.8 % (ref 40.0–52.0)
Hemoglobin: 16.5 g/dL (ref 13.0–18.0)
MCH: 32 pg (ref 26.0–34.0)
MCHC: 35.2 g/dL (ref 32.0–36.0)
MCV: 90.8 fL (ref 80.0–100.0)
Platelets: 200 10*3/uL (ref 150–440)
RBC: 5.16 MIL/uL (ref 4.40–5.90)
RDW: 13.8 % (ref 11.5–14.5)
WBC: 8.5 10*3/uL (ref 3.8–10.6)

## 2018-03-26 LAB — COMPREHENSIVE METABOLIC PANEL
ALT: 50 U/L — AB (ref 0–44)
AST: 43 U/L — AB (ref 15–41)
Albumin: 4 g/dL (ref 3.5–5.0)
Alkaline Phosphatase: 60 U/L (ref 38–126)
Anion gap: 11 (ref 5–15)
BUN: 23 mg/dL — AB (ref 6–20)
CHLORIDE: 100 mmol/L (ref 98–111)
CO2: 27 mmol/L (ref 22–32)
Calcium: 8.9 mg/dL (ref 8.9–10.3)
Creatinine, Ser: 1.09 mg/dL (ref 0.61–1.24)
GFR calc Af Amer: >60 mL/min (ref >60)
GFR calc non Af Amer: >60 mL/min (ref >60)
Glucose, Bld: 148 mg/dL — ABNORMAL HIGH (ref 70–99)
Potassium: 3.9 mmol/L (ref 3.5–5.1)
SODIUM: 138 mmol/L (ref 135–145)
Total Bilirubin: 1.3 mg/dL — ABNORMAL HIGH (ref 0.3–1.2)
Total Protein: 7 g/dL (ref 6.5–8.1)

## 2018-03-26 LAB — LIPASE, BLOOD: LIPASE: 22 U/L (ref 11–51)

## 2018-03-26 MED ORDER — METOCLOPRAMIDE HCL 5 MG/ML IJ SOLN
10.0000 mg | Freq: Once | INTRAMUSCULAR | Status: AC
  Administered 2018-03-26: 10 mg via INTRAVENOUS
  Filled 2018-03-26: qty 2

## 2018-03-26 MED ORDER — SODIUM CHLORIDE 0.9 % IV BOLUS
1000.0000 mL | Freq: Once | INTRAVENOUS | Status: AC
  Administered 2018-03-26: 1000 mL via INTRAVENOUS

## 2018-03-26 NOTE — ED Notes (Signed)
Pt states spouse at home with similar symptoms. Pt states for 2 days generalized abd pain, nausea, vomiting and diarrhea. Pt states has dark concentrated urine. Pt states has not been able to keep po fluids down since 0300. Pt with slight skin tenting noted.

## 2018-03-26 NOTE — ED Triage Notes (Signed)
Patient with complaint of nausea, vomiting and generalized abdominal pain times two days.

## 2018-03-27 MED ORDER — ACETAMINOPHEN 325 MG PO TABS
650.0000 mg | ORAL_TABLET | Freq: Once | ORAL | Status: AC
  Administered 2018-03-27: 650 mg via ORAL
  Filled 2018-03-27: qty 2

## 2018-03-27 MED ORDER — METOCLOPRAMIDE HCL 10 MG PO TABS: 10.0000 mg | ORAL_TABLET | Freq: Three times a day (TID) | ORAL | 0 refills | Status: DC | PRN

## 2018-03-27 NOTE — Discharge Instructions (Addendum)
It appears that you have gastroenteritis. Please ensure that you are taking in adequate amounts of fluid to overcome the dehydration you may develop due to diarrhea. Please return with any worsening abd pain, worsening fevers or worsening vomiting.

## 2018-03-27 NOTE — ED Notes (Signed)
Pt tolerated half a glass of water with tylenol.

## 2018-03-27 NOTE — ED Provider Notes (Signed)
[facility] Emergency Department Provider Note   ____________________________________________   First MD Initiated Contact with Patient 03/26/18 2351     (approximate)  I have reviewed the triage vital signs and the nursing notes.   HISTORY  Chief Complaint Emesis; Diarrhea; and Abdominal Pain    HPI Joseph Church is a 57 y.o. male who comes into the hospital today with a concern for a stomach bug.  The patient states that around 3 AM he started having symptoms of nausea vomiting and diarrhea.  The patient's wife had similar symptoms for 2 days.  He states that he vomited twice about 4 times each episode with his last episode of emesis being 12 hours ago.  The patient states that his diarrhea has been copious and explosive.  He denies any blood in his stool or in his emesis.  He states that he has currently minimal abdominal pain.  He states that he has severe cramps earlier but that has subsided.  He has been able to keep down one bottle of water today.  He has a low-grade temperature.  He is here today for evaluation and treatment.  He did not take any medicines at home.  The patient rates his pain a 2 out of 10 in intensity.   Past Medical History:  Diagnosis Date  . GERD (gastroesophageal reflux disease)   . Hyperlipidemia   . Hypertension     Patient Active Problem List   Diagnosis Date Noted  . Injury of right thumb 02/21/2018  . Meralgia paresthetica of both lower extremities 12/01/2017  . Visit for well man health check 08/25/2017  . Paresthesia 08/25/2017  . Rash 08/25/2017  . Anxiety 04/06/2017  . Insomnia 08/12/2016  . Depression 08/12/2016  . Erectile dysfunction 07/07/2013  . GERD (gastroesophageal reflux disease)   . Hyperlipidemia   . Hypertension     Past Surgical History:  Procedure Laterality Date  . [facility];[place]  . CHOLECYSTECTOMY, LAPAROSCOPIC  2000  .  keratotomy  1995  . TONSILLECTOMY AND ADENOIDECTOMY  1960's  . VASECTOMY  1987  . [place]    Prior to Admission medications   Medication Sig Start Date End Date Taking? Authorizing Provider  busPIRone (BUSPAR) 30 MG tablet TAKE 1/2 OF A TABLET (15 MG TOTAL) BY MOUTH 2 (TWO) TIMES DAILY. 03/16/18  Yes Lucille Passy, MD  metoprolol tartrate (LOPRESSOR) 50 MG tablet Take 1 tablet (50 mg total) by mouth 2 (two) times daily. 04/13/17  Yes Lucille Passy, MD  olmesartan (BENICAR) 20 MG tablet TAKE 1 TABLET BY MOUTH EVERY DAY 02/16/18  Yes Lucille Passy, MD  omeprazole (PRILOSEC) 20 MG capsule Take 1 capsule (20 mg total) by mouth daily as needed. 07/21/17  Yes Lucille Passy, MD  ranitidine (ZANTAC) 150 MG capsule Take 1 capsule (150 mg total) by mouth 2 (two) times daily as needed for heartburn. 08/12/16  Yes Lucille Passy, MD  rosuvastatin (CRESTOR) 40 MG tablet Take 1 tablet (40 mg total) by mouth at bedtime. 09/24/17  Yes Lucille Passy, MD  zolpidem (AMBIEN) 10 MG tablet TAKE 1 TABLET BY MOUTH AT BEDTIME 01/21/18  Yes Lucille Passy, MD  metoCLOPramide (REGLAN) 10 MG tablet Take 1 tablet (10 mg total) by mouth every 8 (eight) hours as needed. 03/27/18   Loney Hering, MD    Allergies Lisinopril  Family History  Problem Relation Age  of Onset  . Cancer Mother   . Cancer Father        lymphoma  . Hypertension Father   . Hyperlipidemia Father   . Heart disease Father 35       CABG x 4    Social History Social History   Tobacco Use  . Smoking status: Never Smoker  . Smokeless tobacco: Never Used  Substance Use Topics  . Alcohol use: Yes    Alcohol/week: 6.0 oz    Types: 10 Standard drinks or equivalent per week  . Drug use: No    Review of Systems  Constitutional: fever Eyes: No visual changes. ENT: No sore throat. Cardiovascular: Denies chest pain. Respiratory: Denies shortness of breath. Gastrointestinal:  abdominal pain, nausea, vomiting, diarrhea.  No  constipation. Genitourinary: Negative for dysuria. Musculoskeletal: Negative for back pain. Skin: Negative for rash. Neurological: Negative for headaches, focal weakness or numbness.   ____________________________________________   PHYSICAL EXAM:  VITAL SIGNS: ED Triage Vitals [03/26/18 2246]  Enc Vitals Group     BP 115/81     Pulse Rate 81     Resp 18     Temp (!) 100.7 F (38.2 C)     Temp Source Oral     SpO2 97 %     Weight 240 lb (108.9 kg)     Height 6\' 1"  (1.854 m)     Head Circumference      Peak Flow      Pain Score 2     Pain Loc      Pain Edu?      Excl. in [place]?     Constitutional: Alert and oriented. Well appearing and in mild distress. Eyes: Conjunctivae are normal. PERRL. EOMI. Head: Atraumatic. Nose: No congestion/rhinnorhea. Mouth/Throat: Mucous membranes are moist.  Oropharynx non-erythematous. Cardiovascular: Normal rate, regular rhythm. Grossly normal heart sounds.  Good peripheral circulation. Respiratory: Normal respiratory effort.  No retractions. Lungs CTAB. Gastrointestinal: Soft and nontender. No distention.  Positive bowel sounds Musculoskeletal: No lower extremity tenderness nor edema.  Neurologic:  Normal speech and language.  Skin:  Skin is warm, dry and intact.  Psychiatric: Mood and affect are normal.   ____________________________________________   LABS (all labs ordered are listed, but only abnormal results are displayed)  Labs Reviewed  COMPREHENSIVE METABOLIC PANEL - Abnormal; Notable for the following components:      Result Value   Glucose, Bld 148 (*)    BUN 23 (*)    AST 43 (*)    ALT 50 (*)    Total Bilirubin 1.3 (*)    All other components within normal limits  URINALYSIS, COMPLETE (UACMP) WITH MICROSCOPIC - Abnormal; Notable for the following components:   Color, Urine YELLOW (*)    APPearance CLEAR (*)    Specific Gravity, Urine 1.031 (*)    Protein, ur 30 (*)    All other components within normal limits    LIPASE, BLOOD  CBC   ____________________________________________  EKG  None ____________________________________________  RADIOLOGY  ED MD interpretation: None  Official radiology report(s): No results found.  ____________________________________________   PROCEDURES  Procedure(s) performed: None  Procedures  Critical Care performed: No  ____________________________________________   INITIAL IMPRESSION / ASSESSMENT AND PLAN / ED COURSE  As part of my medical decision making, I reviewed the following data within the electronic MEDICAL RECORD NUMBER Notes from prior ED visits and [place] Controlled Substance Database   This is a 57 year old male who comes into the hospital today  with some vomiting and diarrhea similar to his wife symptoms concerning for possible gastroenteritis.  We did check some blood work on the patient to include a CBC, CMP, lipase, urinalysis.  The patient does not have an elevated white blood cell count and his glucose is 148.  His lipase is normal and his AST and ALT are slightly elevated at 43 and 50.  The patient's urinalysis does have a specific gravity of 1.031 which indicates that the patient is dehydrated.  I will give him a dose of  Reglan and     A liter of normal saline.  He will be reassessed and we will ensure that he is able to take p.o.  The patient did have a temperature here to 100.7 so I will also give him a dose of Tylenol.  The patient started having some diarrhea in the emergency department but did not want any Imodium.  He states that he wants to get the bug out of him.  When I did go back to reassess him he was appearing well and he was able to keep down some water and ice.  The patient states that he is ready to go home and he feels well.  The patient is well-appearing at this time.  The patient will be discharged home and encouraged to follow-up with his primary care physician. ____________________________________________   FINAL  CLINICAL IMPRESSION(S) / ED DIAGNOSES  Final diagnoses:  Nausea vomiting and diarrhea     ED Discharge Orders        Ordered    metoCLOPramide (REGLAN) 10 MG tablet  Every 8 hours PRN     03/27/18 0110       Note:  This document was prepared using Dragon voice recognition software and may include unintentional dictation errors.    Loney Hering, MD 03/27/18 [phone]

## 2018-03-27 NOTE — ED Notes (Signed)
Pt up to commode for stool production.

## 2018-04-08 ENCOUNTER — Ambulatory Visit
Admission: RE | Admit: 2018-04-08 | Discharge: 2018-04-08 | Disposition: A | Discharge status: Home / Self Care | Payer: Federal, State, Local not specified - PPO | Source: Ambulatory Visit | Attending: Family Medicine | Admitting: Family Medicine

## 2018-04-08 DIAGNOSIS — S6991XA Unspecified injury of right wrist, hand and finger(s), initial encounter: Secondary | ICD-10-CM | POA: Insufficient documentation

## 2018-04-08 DIAGNOSIS — S53441A Ulnar collateral ligament sprain of right elbow, initial encounter: Secondary | ICD-10-CM | POA: Diagnosis not present

## 2018-04-08 DIAGNOSIS — S5330XA Traumatic rupture of unspecified ulnar collateral ligament, initial encounter: Secondary | ICD-10-CM | POA: Insufficient documentation

## 2018-04-08 MED ORDER — GADOBENATE DIMEGLUMINE 529 MG/ML IV SOLN
20.0000 mL | Freq: Once | INTRAVENOUS | Status: AC | PRN
  Administered 2018-04-08: 20 mL via INTRAVENOUS

## 2018-04-09 ENCOUNTER — Other Ambulatory Visit: Payer: Self-pay | Admitting: Family Medicine

## 2018-04-11 ENCOUNTER — Telehealth: Payer: Self-pay | Admitting: Family Medicine

## 2018-04-11 DIAGNOSIS — S6991XA Unspecified injury of right wrist, hand and finger(s), initial encounter: Secondary | ICD-10-CM

## 2018-04-11 MED ORDER — OLMESARTAN MEDOXOMIL 20 MG PO TABS: 20.0000 mg | ORAL_TABLET | Freq: Every day | ORAL | 0 refills | Status: DC

## 2018-04-11 NOTE — Telephone Encounter (Signed)
Dr. Hulen Shouts patient.

## 2018-04-11 NOTE — Telephone Encounter (Signed)
Informed patent of his MRi results. Will give thumb spica splint and refer to hand surgeon.   Rosemarie Ax, MD [facility] 04/11/2018, 11:07 AM

## 2018-04-17 ENCOUNTER — Other Ambulatory Visit: Payer: Self-pay | Admitting: Family Medicine

## 2018-04-22 DIAGNOSIS — G4733 Obstructive sleep apnea (adult) (pediatric): Secondary | ICD-10-CM | POA: Diagnosis not present

## 2018-05-04 DIAGNOSIS — S63641A Sprain of metacarpophalangeal joint of right thumb, initial encounter: Secondary | ICD-10-CM | POA: Diagnosis not present

## 2018-05-04 DIAGNOSIS — S63641D Sprain of metacarpophalangeal joint of right thumb, subsequent encounter: Secondary | ICD-10-CM | POA: Diagnosis not present

## 2018-05-04 DIAGNOSIS — S5331XD Traumatic rupture of right ulnar collateral ligament, subsequent encounter: Secondary | ICD-10-CM | POA: Diagnosis not present

## 2018-05-18 ENCOUNTER — Encounter: Payer: Self-pay | Admitting: Family Medicine

## 2018-05-19 ENCOUNTER — Other Ambulatory Visit: Payer: Self-pay | Admitting: Family Medicine

## 2018-05-19 MED ORDER — ZOLPIDEM TARTRATE 10 MG PO TABS: 10.0000 mg | ORAL_TABLET | Freq: Every day | ORAL | 1 refills | Status: DC

## 2018-05-19 NOTE — Progress Notes (Signed)
Refill sent.

## 2018-06-08 DIAGNOSIS — S63641A Sprain of metacarpophalangeal joint of right thumb, initial encounter: Secondary | ICD-10-CM | POA: Diagnosis not present

## 2018-06-20 ENCOUNTER — Other Ambulatory Visit: Payer: Self-pay | Admitting: Family Medicine

## 2018-07-07 ENCOUNTER — Other Ambulatory Visit: Payer: Self-pay | Admitting: Family Medicine

## 2018-07-11 ENCOUNTER — Other Ambulatory Visit: Payer: Self-pay

## 2018-07-11 ENCOUNTER — Encounter: Payer: Self-pay | Admitting: Family Medicine

## 2018-07-11 NOTE — Telephone Encounter (Signed)
Rx was Joseph Church/Joseph Church in error on 7.6.19/reordered/thx dmf

## 2018-07-12 ENCOUNTER — Other Ambulatory Visit: Payer: Self-pay | Admitting: Family Medicine

## 2018-08-21 ENCOUNTER — Other Ambulatory Visit: Payer: Self-pay | Admitting: Family Medicine

## 2018-08-30 NOTE — Progress Notes (Signed)
Subjective:   Patient ID: Joseph Church, male    DOB: June 24, 1961, 57 y.o.   MRN: [id number]  HALFORD GOETZKE is a pleasant 57 y.o. year old male who presents to clinic today with Annual Exam (Patient is here for a CPE.His Immunizations are UTD.  Colonoscopy next due on 2.15.2022. On 7.6.19 CBC; CMP; Lipase; and U/A were collected and his liver functions were slightly elevated.  His Glucose has creaped up slightly over the past year as well.)  on 08/31/2018  HPI: Here for CPX and follow up of chronic medical conditions.  Health Maintenance  Topic Date Due  . HIV Screening  07/11/2019 (Originally 07/19/1975)  . COLONOSCOPY  11/07/2020  . TETANUS/TDAP  08/12/2026  . INFLUENZA VACCINE  Completed  . Hepatitis C Screening  Completed   HLD- on Crestor 40 mg qhs.    Denies myalgias. Strong FH of CAD. He did have a  neg stress test a few years ago (Dr. Fletcher Anon).  Lab Results  Component Value Date   CHOL 156 08/25/2017   HDL 51.60 08/25/2017   LDLCALC 65 08/25/2017   TRIG 195.0 (H) 08/25/2017   CHOLHDL 3 08/25/2017   Lab Results  Component Value Date   ALT 50 (H) 03/26/2018   AST 43 (H) 03/26/2018   ALKPHOS 60 03/26/2018   BILITOT 1.3 (H) 03/26/2018     HTN- on Benicar 20 mg daily and metoprolol 50 mg twice daily.  BP has been well controlled on this. No CP, SOB or LE edema  Lab Results  Component Value Date   CREATININE 1.09 03/26/2018   Insomnia- still takes akes Ambien several nights per week.  Does not take on weekends or nights that he does not have to work.  Could not tolerate antihistamines or lunesta but feels Lorrin Mais is no longer as effective.   GERD- Symptoms controlled with Zantac and Omeprazole.  Non smoker.   Depression- restarted prozac last year.  Feels symptoms are well controlled.    Current Outpatient Medications on File Prior to Visit  Medication Sig Dispense Refill  . FLUoxetine (PROZAC) 20 MG tablet TAKE 1 TABLET BY MOUTH EVERY DAY 90 tablet 2   . metoprolol tartrate (LOPRESSOR) 50 MG tablet TAKE 1 TABLET BY MOUTH TWICE A DAY 180 tablet 1  . olmesartan (BENICAR) 20 MG tablet TAKE 1 TABLET BY MOUTH EVERY DAY 90 tablet 0  . omeprazole (PRILOSEC) 20 MG capsule TAKE 1 CAPSULE (20 MG TOTAL) BY MOUTH DAILY AS NEEDED. 90 capsule 3  . ranitidine (ZANTAC) 150 MG capsule Take 1 capsule (150 mg total) by mouth 2 (two) times daily as needed for heartburn. 90 capsule 3  . rosuvastatin (CRESTOR) 40 MG tablet TAKE 1 TABLET BY MOUTH EVERYDAY AT BEDTIME 90 tablet 1  . zolpidem (AMBIEN) 10 MG tablet Take 1 tablet (10 mg total) by mouth at bedtime. 30 tablet 1   No current facility-administered medications on file prior to visit.     Allergies  Allergen Reactions  . Lisinopril Cough    Past Medical History:  Diagnosis Date  . GERD (gastroesophageal reflux disease)   . Hyperlipidemia   . Hypertension     Past Surgical History:  Procedure Laterality Date  . [facility];[place]  . CHOLECYSTECTOMY, LAPAROSCOPIC  2000  . keratotomy  1995  . TONSILLECTOMY AND ADENOIDECTOMY  1960's  . VASECTOMY  1987  . [place]    Family History  Problem Relation Age of Onset  . Cancer Mother   . Cancer Father        lymphoma  . Hypertension Father   . Hyperlipidemia Father   . Heart disease Father 98       CABG x 4    Social History   Socioeconomic History  . Marital status: Married    Spouse name: Not on file  . Number of children: 5  . Years of education: Not on file  . Highest education level: Not on file  Occupational History  . Occupation: PA  Social Needs  . Financial resource strain: Not on file  . Food insecurity:    Worry: Not on file    Inability: Not on file  . Transportation needs:    Medical: Not on file    Non-medical: Not on file  Tobacco Use  . Smoking status: Never Smoker  . Smokeless tobacco: Never Used  Substance and Sexual Activity  .  Alcohol use: Yes    Alcohol/week: 10.0 standard drinks    Types: 10 Standard drinks or equivalent per week  . Drug use: No  . Sexual activity: Not on file  Lifestyle  . Physical activity:    Days per week: Not on file    Minutes per session: Not on file  . Stress: Not on file  Relationships  . Social connections:    Talks on phone: Not on file    Gets together: Not on file    Attends religious service: Not on file    Active member of club or organization: Not on file    Attends meetings of clubs or organizations: Not on file    Relationship status: Not on file  . Intimate partner violence:    Fear of current or ex partner: Not on file    Emotionally abused: Not on file    Physically abused: Not on file    Forced sexual activity: Not on file  Other Topics Concern  . Not on file  Social History Narrative   28 [place]   Divorced 3 times   Has 5 children- two teenagers that live in [place]   [place], [place], Social History, Family History, Medications, and allergies have been reviewed in [facility], and have been updated if relevant.   Review of Systems  Constitutional: Negative.   HENT: Negative.   Respiratory: Negative.   Cardiovascular: Negative.   Gastrointestinal: Negative.   Endocrine: Negative.   Genitourinary: Negative.   Musculoskeletal: Negative.   Skin: Negative.   Allergic/Immunologic: Negative.   Neurological: Negative.   Hematological: Negative.   Psychiatric/Behavioral: Positive for sleep disturbance. Negative for agitation, behavioral problems, confusion, decreased concentration, dysphoric mood, hallucinations, self-injury and suicidal ideas. The patient is not nervous/anxious and is not hyperactive.   All other systems reviewed and are negative.      Objective:    BP 114/76 (BP Location: Left Arm, Patient Position: Sitting, Cuff Size: Normal)   Pulse 71   Temp 98.4 F (36.9 C) (Oral)   Ht 6' 0.64" (1.845 m)   Wt 251 lb 6.4 oz (114 kg)    SpO2 98%   BMI 33.50 kg/m    Physical Exam  General:  pleasant male in no acute distress Eyes:  PERRL Ears:  External ear exam shows no significant lesions or deformities.  TMs normal bilaterally Hearing is grossly normal bilaterally. Nose:  External nasal examination shows no deformity or inflammation. Nasal mucosa are pink and moist without lesions  or exudates. Mouth:  Oral mucosa and oropharynx without lesions or exudates.  Teeth in good repair. Neck:  no carotid bruit or thyromegaly no cervical or supraclavicular lymphadenopathy  Lungs:  Normal respiratory effort, chest expands symmetrically. Lungs are clear to auscultation, no crackles or wheezes. Heart:  Normal rate and regular rhythm. S1 and S2 normal without gallop, murmur, click, rub or other extra sounds. Abdomen:  Bowel sounds positive,abdomen soft and non-tender without masses, organomegaly or hernias noted. Pulses:  R and L posterior tibial pulses are full and equal bilaterally  Extremities:  no edema  Psych:  Good eye contact, not anxious or depressed appearing        Assessment & Plan:   Visit for well man health check - Plan: PSA, Lipid Profile, Comp Met (CMET), POCT HgB A1C, CBC w/Diff  Essential hypertension - Plan: Lipid Profile, Comp Met (CMET), CBC w/Diff  Hyperlipidemia, unspecified hyperlipidemia type - Plan: Lipid Profile, Comp Met (CMET), CBC w/Diff  Elevated glucose - Plan: POCT HgB A1C  Screening for prostate cancer - Plan: PSA  Gastroesophageal reflux disease, esophagitis presence not specified No follow-ups on file.

## 2018-08-31 ENCOUNTER — Ambulatory Visit (INDEPENDENT_AMBULATORY_CARE_PROVIDER_SITE_OTHER): Payer: Federal, State, Local not specified - PPO | Admitting: Family Medicine

## 2018-08-31 ENCOUNTER — Encounter: Payer: Self-pay | Admitting: Family Medicine

## 2018-08-31 VITALS — BP 114/76 | HR 71 | Temp 98.4°F | Ht 72.64 in | Wt 251.4 lb

## 2018-08-31 DIAGNOSIS — K219 Gastro-esophageal reflux disease without esophagitis: Secondary | ICD-10-CM | POA: Diagnosis not present

## 2018-08-31 DIAGNOSIS — Z0000 Encounter for general adult medical examination without abnormal findings: Secondary | ICD-10-CM | POA: Diagnosis not present

## 2018-08-31 DIAGNOSIS — E785 Hyperlipidemia, unspecified: Secondary | ICD-10-CM | POA: Diagnosis not present

## 2018-08-31 DIAGNOSIS — I10 Essential (primary) hypertension: Secondary | ICD-10-CM | POA: Diagnosis not present

## 2018-08-31 DIAGNOSIS — F32A Depression, unspecified: Secondary | ICD-10-CM

## 2018-08-31 DIAGNOSIS — G4700 Insomnia, unspecified: Secondary | ICD-10-CM

## 2018-08-31 DIAGNOSIS — Z125 Encounter for screening for malignant neoplasm of prostate: Secondary | ICD-10-CM | POA: Diagnosis not present

## 2018-08-31 DIAGNOSIS — F329 Major depressive disorder, single episode, unspecified: Secondary | ICD-10-CM

## 2018-08-31 DIAGNOSIS — R7309 Other abnormal glucose: Secondary | ICD-10-CM

## 2018-08-31 LAB — POCT GLYCOSYLATED HEMOGLOBIN (HGB A1C): HbA1c POC (<> result, manual entry): 5.5 % (ref 4.0–5.6)

## 2018-08-31 LAB — CBC WITH DIFFERENTIAL/PLATELET
BASOS ABS: 0.1 10*3/uL (ref 0.0–0.1)
Basophils Relative: 0.7 % (ref 0.0–3.0)
Eosinophils Absolute: 0.2 10*3/uL (ref 0.0–0.7)
Eosinophils Relative: 2.3 % (ref 0.0–5.0)
HCT: 47.4 % (ref 39.0–52.0)
HEMOGLOBIN: 16.7 g/dL (ref 13.0–17.0)
Lymphocytes Relative: 30.3 % (ref 12.0–46.0)
Lymphs Abs: 2.4 10*3/uL (ref 0.7–4.0)
MCHC: 35.3 g/dL (ref 30.0–36.0)
MCV: 90.4 fl (ref 78.0–100.0)
MONOS PCT: 7.6 % (ref 3.0–12.0)
Monocytes Absolute: 0.6 10*3/uL (ref 0.1–1.0)
NEUTROS ABS: 4.7 10*3/uL (ref 1.4–7.7)
Neutrophils Relative %: 59.1 % (ref 43.0–77.0)
Platelets: 210 10*3/uL (ref 150.0–400.0)
RBC: 5.24 Mil/uL (ref 4.22–5.81)
RDW: 13.4 % (ref 11.5–15.5)
WBC: 8 10*3/uL (ref 4.0–10.5)

## 2018-08-31 LAB — COMPREHENSIVE METABOLIC PANEL
ALBUMIN: 4.6 g/dL (ref 3.5–5.2)
ALK PHOS: 67 U/L (ref 39–117)
ALT: 27 U/L (ref 0–53)
AST: 22 U/L (ref 0–37)
BUN: 21 mg/dL (ref 6–23)
CALCIUM: 10 mg/dL (ref 8.4–10.5)
CHLORIDE: 101 meq/L (ref 96–112)
CO2: 29 meq/L (ref 19–32)
Creatinine, Ser: 1.08 mg/dL (ref 0.40–1.50)
GFR: 74.61 mL/min (ref >60.00)
Glucose, Bld: 108 mg/dL — ABNORMAL HIGH (ref 70–99)
Potassium: 4.5 mEq/L (ref 3.5–5.1)
Sodium: 139 mEq/L (ref 135–145)
TOTAL PROTEIN: 6.9 g/dL (ref 6.0–8.3)
Total Bilirubin: 1 mg/dL (ref 0.2–1.2)

## 2018-08-31 LAB — LIPID PANEL
Cholesterol: 161 mg/dL (ref 0–200)
HDL: 46.9 mg/dL (ref >39.00)
NonHDL: 114.46
TRIGLYCERIDES: 219 mg/dL — AB (ref 0.0–149.0)
Total CHOL/HDL Ratio: 3
VLDL: 43.8 mg/dL — AB (ref 0.0–40.0)

## 2018-08-31 LAB — LDL CHOLESTEROL, DIRECT: Direct LDL: 95 mg/dL

## 2018-08-31 LAB — PSA: PSA: 0.53 ng/mL (ref 0.10–4.00)

## 2018-08-31 MED ORDER — FAMOTIDINE 20 MG PO TABS: 20.0000 mg | ORAL_TABLET | Freq: Two times a day (BID) | ORAL | Status: DC

## 2018-08-31 MED ORDER — SUVOREXANT 10 MG PO TABS: 1.0000 | ORAL_TABLET | Freq: Every evening | ORAL | 2 refills | Status: DC | PRN

## 2018-08-31 MED ORDER — BENZONATATE 100 MG PO CAPS: 100.0000 mg | ORAL_CAPSULE | Freq: Two times a day (BID) | ORAL | 0 refills | Status: DC | PRN

## 2018-08-31 NOTE — Assessment & Plan Note (Signed)
Well controlled with Prozac without Buspar.  No changes made to rxs today.

## 2018-08-31 NOTE — Assessment & Plan Note (Signed)
Discussed recent recall of xantac. D/c Zantac.  Start pepcid 20 mg twice daily with prilosec as needed for breakthrough symptoms. Call or return to clinic prn if these symptoms worsen or fail to improve as anticipated. The patient indicates understanding of these issues and agrees with the plan.

## 2018-08-31 NOTE — Assessment & Plan Note (Signed)
On Crestor- due for labs, check them today.

## 2018-08-31 NOTE — Assessment & Plan Note (Signed)
Controlled on current rxs. No changes made today. 

## 2018-08-31 NOTE — Patient Instructions (Addendum)
Great to see you. I will call you with your lab results from today and you can view them online.   We are starting Belsoma 10 mg nightly.  Please update me.

## 2018-08-31 NOTE — Assessment & Plan Note (Signed)
Reviewed preventive care protocols, scheduled due services, and updated immunizations Discussed nutrition, exercise, diet, and healthy lifestyle.  

## 2018-08-31 NOTE — Assessment & Plan Note (Signed)
  The problem of recurrent insomnia is discussed. Avoidance of caffeine sources is strongly encouraged. Sleep hygiene issues are reviewed. The use of sedative hypnotics for temporary relief is appropriate; we discussed the addictive nature of these drugs, and a one-time only prescription for prn use of a hypnotic is given, to use no more than 3 times per week for 2-3 weeks. D/c Ambien.  Start Sonata as needed- discussed side effects, etc. Call or return to clinic prn if these symptoms worsen or fail to improve as anticipated. The patient indicates understanding of these issues and agrees with the plan.

## 2018-10-02 ENCOUNTER — Other Ambulatory Visit: Payer: Self-pay | Admitting: Family Medicine

## 2018-10-06 DIAGNOSIS — G4733 Obstructive sleep apnea (adult) (pediatric): Secondary | ICD-10-CM | POA: Diagnosis not present

## 2018-10-07 ENCOUNTER — Telehealth: Payer: Self-pay

## 2018-10-07 NOTE — Telephone Encounter (Signed)
Dr Deborra Medina, [facility] reached out about the Belsorma 10 mg. The insurance will only approve to cover 30 tablets/365 days. Next fill available 08/31/2019. They would like to know is there an alternative. Please advise.

## 2018-10-07 NOTE — Telephone Encounter (Signed)
Has he contacted his insurance company to see what they will cover?

## 2018-10-10 NOTE — Telephone Encounter (Signed)
Left a vm for the pt to call back. 

## 2018-10-10 NOTE — Telephone Encounter (Signed)
Spoke with the pt, he stated his insurance is not going to cover ambien and belsomra any more. He will call them and let us know alternative med.

## 2018-10-11 ENCOUNTER — Encounter: Payer: Self-pay | Admitting: Family Medicine

## 2018-10-11 NOTE — Telephone Encounter (Signed)
Left pt a vm to call back. Alternative medication name is needed to complete the PA.

## 2018-10-11 NOTE — Telephone Encounter (Signed)
Patient is returning call stating that his insurance company needs a prior approval for his medication.  The number he left for the approval is [phone].  Patient stated that the doctor's office has to call to get this approval.  If there are any other questions, please leave a detailed message for patient at [phone]

## 2018-10-14 ENCOUNTER — Other Ambulatory Visit: Payer: Self-pay

## 2018-10-14 NOTE — Telephone Encounter (Signed)
PA initiated , KEY : [id number]

## 2018-10-25 ENCOUNTER — Encounter: Payer: Self-pay | Admitting: Family Medicine

## 2018-10-25 DIAGNOSIS — K080 Exfoliation of teeth due to systemic causes: Secondary | ICD-10-CM | POA: Diagnosis not present

## 2018-10-29 ENCOUNTER — Encounter: Payer: Self-pay | Admitting: Family Medicine

## 2018-10-31 ENCOUNTER — Other Ambulatory Visit: Payer: Self-pay

## 2018-10-31 MED ORDER — METOPROLOL TARTRATE 50 MG PO TABS: 50.0000 mg | ORAL_TABLET | Freq: Two times a day (BID) | ORAL | 1 refills | Status: DC

## 2018-10-31 NOTE — Telephone Encounter (Signed)
Nagi Dinneen  (Key: [id number])  Another PA started 10/26/2018 waiting to hear results.

## 2018-11-01 ENCOUNTER — Encounter: Payer: Self-pay | Admitting: Family Medicine

## 2018-11-02 ENCOUNTER — Other Ambulatory Visit: Payer: Self-pay

## 2018-11-02 MED ORDER — ZALEPLON 10 MG PO CAPS: 10.0000 mg | ORAL_CAPSULE | Freq: Every evening | ORAL | 5 refills | Status: DC | PRN

## 2018-11-30 ENCOUNTER — Other Ambulatory Visit: Payer: Self-pay

## 2018-11-30 MED ORDER — OLMESARTAN MEDOXOMIL 20 MG PO TABS: 20.0000 mg | ORAL_TABLET | Freq: Every day | ORAL | 0 refills | Status: DC

## 2018-12-14 ENCOUNTER — Telehealth: Payer: Self-pay | Admitting: Family Medicine

## 2018-12-14 MED ORDER — IBUPROFEN 800 MG PO TABS: 800.0000 mg | ORAL_TABLET | Freq: Three times a day (TID) | ORAL | 0 refills | Status: DC | PRN

## 2018-12-14 NOTE — Addendum Note (Signed)
Addended by: Lucille Passy on: 12/14/2018 09:22 AM   Modules accepted: Orders

## 2018-12-14 NOTE — Telephone Encounter (Signed)
Patient called and wanted to know if Dr Deborra Medina would prescribe him Motrin 800 mg tid #90, He said his left knee has Acute prepatellar pacidis. Patient said he would like a Pharmacist, community message with approval or denial.

## 2018-12-19 ENCOUNTER — Encounter: Payer: Self-pay | Admitting: Family Medicine

## 2018-12-20 ENCOUNTER — Other Ambulatory Visit: Payer: Self-pay | Admitting: Family Medicine

## 2018-12-20 MED ORDER — BACLOFEN 10 MG PO TABS: 10.0000 mg | ORAL_TABLET | Freq: Three times a day (TID) | ORAL | 0 refills | Status: DC

## 2018-12-22 ENCOUNTER — Other Ambulatory Visit: Payer: Self-pay | Admitting: Family Medicine

## 2019-01-06 ENCOUNTER — Other Ambulatory Visit: Payer: Self-pay | Admitting: Family Medicine

## 2019-01-16 ENCOUNTER — Other Ambulatory Visit: Payer: Self-pay | Admitting: Family Medicine

## 2019-01-16 NOTE — Telephone Encounter (Signed)
Dr. Deborra Medina please advise baclofen was ordered 12/20/2018 #90 TID.

## 2019-02-09 ENCOUNTER — Other Ambulatory Visit: Payer: Self-pay | Admitting: Family Medicine

## 2019-02-10 ENCOUNTER — Other Ambulatory Visit: Payer: Self-pay | Admitting: Family Medicine

## 2019-02-24 ENCOUNTER — Other Ambulatory Visit: Payer: Self-pay | Admitting: Family Medicine

## 2019-03-12 ENCOUNTER — Other Ambulatory Visit: Payer: Self-pay | Admitting: Family Medicine

## 2019-03-21 ENCOUNTER — Encounter: Payer: Self-pay | Admitting: Family Medicine

## 2019-03-30 ENCOUNTER — Ambulatory Visit: Payer: Federal, State, Local not specified - PPO | Admitting: Family Medicine

## 2019-03-30 ENCOUNTER — Ambulatory Visit: Payer: Self-pay

## 2019-03-30 ENCOUNTER — Other Ambulatory Visit: Payer: Self-pay

## 2019-03-30 ENCOUNTER — Encounter: Payer: Self-pay | Admitting: Family Medicine

## 2019-03-30 ENCOUNTER — Ambulatory Visit (INDEPENDENT_AMBULATORY_CARE_PROVIDER_SITE_OTHER)
Admission: RE | Admit: 2019-03-30 | Discharge: 2019-03-30 | Disposition: A | Discharge status: Home / Self Care | Payer: Federal, State, Local not specified - PPO | Source: Ambulatory Visit | Attending: Family Medicine | Admitting: Family Medicine

## 2019-03-30 VITALS — BP 100/70 | HR 70 | Ht 72.0 in | Wt 252.0 lb

## 2019-03-30 DIAGNOSIS — S5331XD Traumatic rupture of right ulnar collateral ligament, subsequent encounter: Secondary | ICD-10-CM | POA: Insufficient documentation

## 2019-03-30 DIAGNOSIS — M79645 Pain in left finger(s): Secondary | ICD-10-CM | POA: Diagnosis not present

## 2019-03-30 DIAGNOSIS — G8929 Other chronic pain: Secondary | ICD-10-CM

## 2019-03-30 DIAGNOSIS — M79644 Pain in right finger(s): Secondary | ICD-10-CM

## 2019-03-30 DIAGNOSIS — M25561 Pain in right knee: Secondary | ICD-10-CM | POA: Diagnosis not present

## 2019-03-30 DIAGNOSIS — M65312 Trigger thumb, left thumb: Secondary | ICD-10-CM | POA: Diagnosis not present

## 2019-03-30 DIAGNOSIS — M79671 Pain in right foot: Secondary | ICD-10-CM

## 2019-03-30 NOTE — Assessment & Plan Note (Signed)
Right heel pain.  Seems to be a tear of the plantar fascia, home exercises given, discussed over-the-counter orthotics and proper shoes.  Follow-up again in 4-6 weeks

## 2019-03-30 NOTE — Patient Instructions (Addendum)
Exercise 3x a week Rigid sole shoes Avoid being barefoot Wear brace at night Xray knee downstairs

## 2019-03-30 NOTE — Assessment & Plan Note (Signed)
  Shows been ruptured previously.  Patient has been doing conservative therapy.  Has responded well to simvastatin injection previously.  Continue

## 2019-03-30 NOTE — Assessment & Plan Note (Signed)
Right knee pain.  Likely patellofemoral arthritis.  X-rays ordered today.  Mild exercise, topical anti-inflammatories.  Which activities to do which wants to avoid.  Follow-up again in 4 to 6 weeks

## 2019-03-30 NOTE — Progress Notes (Signed)
[facility] [address], [place] [address] Phone: [phone] Subjective:   I [name] am serving as a Education administrator for Dr. Hulan Saas.   CC: right wrist and heel pain   QGB:[id number]  Joseph Church is a 58 y.o. male coming in with complaint of bilateral thumb and right foot pain. Torn ligament in the right thumb. Trigger thumb in left thumb. Heel pain 6 weeks ago. Injured the heel shoveling.   Onset- Chronic  Location - Heel pain Duration-  Character-dull aching pain. Aggravating factors- walking Reliving factors- ice, heat, topical, oral  Therapies tried-  Severity-6 out of 10 by the end of the day. Softer shoes seem to be more helpful    Left thumb common triggers on a more regular basis.  He describes more of a tightness.  Patient states that unfortunately gets worse over all  Right thumb patient has had pain as well.  Reviewing patient's chart MRI showed an UCL tear a year ago.  Patient has been dealing with it conservatively.  Bracing intermittently.  Wanting to know what else can be done.  Right knee hurts when going downstairs.  No instability.  States intermittent swelling.  Describes it as an ache more than the pain but sometimes sharp.  Better with rest.  Past Medical History:  Diagnosis Date  . GERD (gastroesophageal reflux disease)   . Hyperlipidemia   . Hypertension    Past Surgical History:  Procedure Laterality Date  . [place]  . CHOLECYSTECTOMY, LAPAROSCOPIC  2000  . keratotomy  1995  . TONSILLECTOMY AND ADENOIDECTOMY  1960's  . VASECTOMY  1987  . VASECTOMY REVERSAL  1994   Social History   Socioeconomic History  . Marital status: Married    Spouse name: Not on file  . Number of children: 5  . Years of education: Not on file  . Highest education level: Not on file  Occupational History  . Occupation: PA  Social Needs  . Financial  resource strain: Not on file  . Food insecurity    Worry: Not on file    Inability: Not on file  . Transportation needs    Medical: Not on file    Non-medical: Not on file  Tobacco Use  . Smoking status: Never Smoker  . Smokeless tobacco: Never Used  Substance and Sexual Activity  . Alcohol use: Yes    Alcohol/week: 10.0 standard drinks    Types: 10 Standard drinks or equivalent per week  . Drug use: No  . Sexual activity: Not on file  Lifestyle  . Physical activity    Days per week: Not on file    Minutes per session: Not on file  . Stress: Not on file  Relationships  . Social Herbalist on phone: Not on file    Gets together: Not on file    Attends religious service: Not on file    Active member of club or organization: Not on file    Attends meetings of clubs or organizations: Not on file    Relationship status: Not on file  Other Topics Concern  . Not on file  Social History Narrative   [address] [place]   Divorced 3 times   Has 5 children- two teenagers that live in [place] Reactions  . Lisinopril Cough   Family History  Problem Relation Age of Onset  .  Cancer Mother   . Cancer Father        lymphoma  . Hypertension Father   . Hyperlipidemia Father   . Heart disease Father 1       CABG x 4     Current Outpatient Medications (Cardiovascular):  .  metoprolol tartrate (LOPRESSOR) 50 MG tablet, Take 1 tablet (50 mg total) by mouth 2 (two) times daily. Marland Kitchen  olmesartan (BENICAR) 20 MG tablet, TAKE 1 TABLET BY MOUTH EVERY DAY .  rosuvastatin (CRESTOR) 40 MG tablet, TAKE 1 TABLET BY MOUTH EVERYDAY AT BEDTIME  Current Outpatient Medications (Respiratory):  .  benzonatate (TESSALON) 100 MG capsule, Take 1 capsule (100 mg total) by mouth 2 (two) times daily as needed for cough.  Current Outpatient Medications (Analgesics):  .  ibuprofen (ADVIL) 800 MG tablet, TAKE 1 TABLET BY MOUTH EVERY 8 HOURS AS NEEDED   Current  Outpatient Medications (Other):  .  baclofen (LIORESAL) 10 MG tablet, TAKE 1 TABLET BY MOUTH THREE TIMES A DAY .  famotidine (PEPCID) 20 MG tablet, Take 1 tablet (20 mg total) by mouth 2 (two) times daily. Marland Kitchen  FLUoxetine (PROZAC) 20 MG tablet, TAKE 1 TABLET BY MOUTH EVERY DAY .  omeprazole (PRILOSEC) 20 MG capsule, TAKE 1 CAPSULE (20 MG TOTAL) BY MOUTH DAILY AS NEEDED. .  zaleplon (SONATA) 10 MG capsule, Take 1 capsule (10 mg total) by mouth at bedtime as needed for sleep.    Past medical history, social, surgical and family history all reviewed in electronic medical record.  No pertanent information unless stated regarding to the chief complaint.   Review of Systems:  No headache, visual changes, nausea, vomiting, diarrhea, constipation, dizziness, abdominal pain, skin rash, fevers, chills, night sweats, weight loss, swollen lymph nodes, body aches, joint swelling, muscle aches, chest pain, shortness of breath, mood changes.   Objective  Blood pressure 100/70, pulse 70, height 6' (1.829 m), weight 252 lb (114.3 kg), SpO2 96 %.   General: No apparent distress alert and oriented x3 mood and affect normal, dressed appropriately.  HEENT: Pupils equal, extraocular movements intact  Respiratory: Patient's speak in full sentences and does not appear short of breath  Cardiovascular: No lower extremity edema, non tender, no erythema  Skin: Warm dry intact with no signs of infection or rash on extremities or on axial skeleton.  Abdomen: Soft nontender  Neuro: Cranial nerves II through XII are intact, neurovascularly intact in all extremities with 2+ DTRs and 2+ pulses.  Lymph: No lymphadenopathy of posterior or anterior cervical chain or axillae bilaterally.  Gait normal with good balance and coordination.  MSK:  Non tender with full range of motion and good stability and symmetric strength and tone of shoulders, elbows, , hip, and ankles bilaterally.   Right knee shows some mild patella grind.   Full range of motion of the knee though.  Good stability of all the ligaments.  5 out of 5 strength noted of the lower extremities that are symmetric   Right thumb shows the patient does have some gapping of the UCL.  Mild osteoarthritic changes with a positive grind of the [place].  Otherwise wrist unremarkable Left thumb shows trigger nodule at the A2 pulley.  Severely tender.  Patient does have triggering noted with flexion of the thumb.  Mild osteoarthritic changes of the PIP joint noted.  Right foot exam shows the patient does have tender over the calcaneal region plantar aspect.  Not over the medial aspect.  Mild breakdown the longitudinal  and transverse arch.  Negative Tinel sign   Procedure: Real-time Ultrasound Guided Injection of left flexor tendon sheath of the thumb Device: GE Logiq Q7 Ultrasound guided injection is preferred based studies that show increased duration, increased effect, greater accuracy, decreased procedural pain, increased response rate, and decreased cost with ultrasound guided versus blind injection.  Verbal informed consent obtained.  Time-out conducted.  Noted no overlying erythema, induration, or other signs of local infection.  Skin prepped in a sterile fashion.  Local anesthesia: Topical Ethyl chloride.  With sterile technique and under real time ultrasound guidance: The 25-gauge half inch needle patient was injected with 0.5 cc of 0.5% Marcaine and 0.5 cc of Kenalog 40 mg/mL Completed without difficulty  Pain immediately resolved suggesting accurate placement of the medication.  Advised to call if fevers/chills, erythema, induration, drainage, or persistent bleeding.  Images permanently stored and available for review in the ultrasound unit.  Impression: Technically successful ultrasound guided injection. Impression and Recommendations:     This case required medical decision making of moderate complexity. The above documentation has been reviewed and is  accurate and complete Lyndal Pulley, DO       Note: This dictation was prepared with Dragon dictation along with smaller phrase technology. Any transcriptional errors that result from this process are unintentional.

## 2019-03-30 NOTE — Assessment & Plan Note (Signed)
Patient given injection today.  Tolerated the procedure well.  Discussed that the triggering may get worse over the course the next 48 hours and then should improve.  Discussed icing regimen.  May need repeat every 9 to 18 months.

## 2019-03-31 ENCOUNTER — Encounter: Payer: Self-pay | Admitting: Family Medicine

## 2019-04-13 ENCOUNTER — Other Ambulatory Visit: Payer: Self-pay | Admitting: Family Medicine

## 2019-04-16 ENCOUNTER — Encounter: Payer: Self-pay | Admitting: Family Medicine

## 2019-04-17 ENCOUNTER — Other Ambulatory Visit: Payer: Self-pay | Admitting: Family Medicine

## 2019-04-17 MED ORDER — PREDNISONE 20 MG PO TABS: ORAL_TABLET | ORAL | 0 refills | Status: AC

## 2019-05-04 ENCOUNTER — Other Ambulatory Visit: Payer: Self-pay | Admitting: Family Medicine

## 2019-05-05 NOTE — Telephone Encounter (Signed)
TA-Plz see refill request per [place] PMP pt compliant without red flags/thx dmf

## 2019-05-27 ENCOUNTER — Other Ambulatory Visit: Payer: Self-pay | Admitting: Family Medicine

## 2019-06-22 ENCOUNTER — Other Ambulatory Visit: Payer: Self-pay | Admitting: Family Medicine

## 2019-07-07 ENCOUNTER — Other Ambulatory Visit: Payer: Self-pay | Admitting: Family Medicine

## 2019-08-30 ENCOUNTER — Encounter: Payer: Self-pay | Admitting: Family Medicine

## 2019-09-05 NOTE — Progress Notes (Signed)
Subjective:    Patient ID: Joseph [facility], male    DOB: Feb 26, 1961, 58 y.o.   MRN: [id number]  Chief Complaint  Patient presents with  . Annual Exam    HPI  Patient is in today for CPX and follow up of chronic medical conditions.   Declines HIV lab draw.   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  Health Maintenance  Topic Date Due  . COLONOSCOPY  11/07/2020  . TETANUS/TDAP  08/12/2026  . INFLUENZA VACCINE  Completed  . Hepatitis C Screening  Completed  . HIV Screening  Discontinued   UTD colonoscopy- 2012- 10 year recall. Joseph Church is currently fasting. Joseph Church received his flu shot on 10.15.20. UTD on all other vaccines  Strong FH of CAD. Joseph Church did have a  neg stress test a few years ago (Dr. Fletcher Anon).    HLD- taking crestor 40 mg daily.  Lab Results  Component Value Date   CHOL 161 08/31/2018   HDL 46.90 08/31/2018   LDLCALC 65 08/25/2017   LDLDIRECT 95.0 08/31/2018   TRIG 219.0 (H) 08/31/2018   CHOLHDL 3 08/31/2018   The 10-year ASCVD risk score Mikey Bussing DC Jr., et al., 2013) is: 5.5%   Values used to calculate the score:     Age: 73 years     Sex: Male     Is Non-Hispanic African American: No     Diabetic: No     Tobacco smoker: No     Systolic Blood Pressure: 123456 mmHg     Is BP treated: Yes     HDL Cholesterol: 46.9 mg/dL     Total Cholesterol: 161 mg/dL  HTN- on Benicar 20 mg daily, metoprolol.  BP has been well controlled on this. No CP, SOB or LE edema.   Insomnia- currently taking sonata.  Does not take on weekends or nights that Joseph Church does not have to work.  Could not tolerate antihistamines or lunesta.   GERD- Symptoms controlled with Zantac and Omeprazole.  Non smoker.    Depression- no longer taking prozac or buspar.  Feels symptoms are well controlled without prozac.    Depression screen [facility] 2/9 09/06/2019 08/31/2018 08/25/2017  Decreased Interest 0 0 0  Down, Depressed, Hopeless 0 0 0  PHQ - 2 Score 0 0 0  Altered sleeping - 2 0    Tired, decreased energy - 1 0  Change in appetite - 1 0  Feeling bad or failure about yourself  - 0 0  Trouble concentrating - 0 0  Moving slowly or fidgety/restless - 0 0  Suicidal thoughts - 0 0  PHQ-9 Score - 4 0  Difficult doing work/chores - Somewhat difficult -     Past Medical History:  Diagnosis Date  . GERD (gastroesophageal reflux disease)   . Hyperlipidemia   . Hypertension     Past Surgical History:  Procedure Laterality Date  . [facility];[place]  . CHOLECYSTECTOMY, LAPAROSCOPIC  2000  . keratotomy  1995  . TONSILLECTOMY AND ADENOIDECTOMY  1960's  . VASECTOMY  1987  . VASECTOMY REVERSAL  1994    Family History  Problem Relation Age of Onset  . Cancer Mother   . Cancer Father        lymphoma  . Hypertension Father   . Hyperlipidemia Father   . Heart disease Father 13       CABG x 4    Social History  Socioeconomic History  . Marital status: Married    Spouse name: Not on file  . Number of children: 5  . Years of education: Not on file  . Highest education level: Not on file  Occupational History  . Occupation: PA  Tobacco Use  . Smoking status: Never Smoker  . Smokeless tobacco: Never Used  Substance and Sexual Activity  . Alcohol use: Yes    Alcohol/week: 10.0 standard drinks    Types: 10 Standard drinks or equivalent per week  . Drug use: No  . Sexual activity: Not on file  Other Topics Concern  . Not on file  Social History Narrative   41 [place]   Divorced 3 times   Has 5 children- two teenagers that live in [place] Determinants of Health   Financial Resource Strain:   . Difficulty of Paying Living Expenses: Not on file  Food Insecurity:   . Worried About Charity fundraiser in the Last Year: Not on file  . Ran Out of Food in the Last Year: Not on file  Transportation Needs:   . Lack of Transportation (Medical): Not on file  . Lack of  Transportation (Non-Medical): Not on file  Physical Activity:   . Days of Exercise per Week: Not on file  . Minutes of Exercise per Session: Not on file  Stress:   . Feeling of Stress : Not on file  Social Connections:   . Frequency of Communication with Friends and Family: Not on file  . Frequency of Social Gatherings with Friends and Family: Not on file  . Attends Religious Services: Not on file  . Active Member of Clubs or Organizations: Not on file  . Attends Archivist Meetings: Not on file  . Marital Status: Not on file  Intimate Partner Violence:   . Fear of Current or Ex-Partner: Not on file  . Emotionally Abused: Not on file  . Physically Abused: Not on file  . Sexually Abused: Not on file    Outpatient Medications Prior to Visit  Medication Sig Dispense Refill  . omeprazole (PRILOSEC) 20 MG capsule TAKE 1 CAPSULE (20 MG TOTAL) BY MOUTH DAILY AS NEEDED. 90 capsule 3  . rosuvastatin (CRESTOR) 40 MG tablet TAKE 1 TABLET BY MOUTH EVERYDAY AT BEDTIME 90 tablet 1  . zaleplon (SONATA) 10 MG capsule TAKE 1 CAPSULE BY MOUTH AT BEDTIME AS NEEDED SLEEP 30 capsule 5  . baclofen (LIORESAL) 10 MG tablet TAKE 1 TABLET BY MOUTH THREE TIMES A DAY 90 tablet 0  . benzonatate (TESSALON) 100 MG capsule Take 1 capsule (100 mg total) by mouth 2 (two) times daily as needed for cough. 20 capsule 0  . famotidine (PEPCID) 20 MG tablet Take 1 tablet (20 mg total) by mouth 2 (two) times daily. 60 tablet   . Fluoxetine HCl, PMDD, 20 MG TABS TAKE 1 TABLET BY MOUTH EVERY DAY 90 tablet 2  . ibuprofen (ADVIL) 800 MG tablet TAKE 1 TABLET BY MOUTH EVERY 8 HOURS AS NEEDED 90 tablet 0  . metoprolol tartrate (LOPRESSOR) 50 MG tablet TAKE 1 TABLET BY MOUTH TWICE A DAY 180 tablet 1  . olmesartan (BENICAR) 20 MG tablet TAKE 1 TABLET BY MOUTH EVERY DAY 90 tablet 0   No facility-administered medications prior to visit.    Allergies  Allergen Reactions  . Lisinopril Cough    Review of Systems    Constitutional: Negative for fever and malaise/fatigue.  HENT: Negative for congestion and  hearing loss.   Eyes: Negative for blurred vision, discharge and redness.  Respiratory: Negative for cough and shortness of breath.   Cardiovascular: Negative for chest pain, palpitations and leg swelling.  Gastrointestinal: Negative for abdominal pain and heartburn.  Genitourinary: Negative for dysuria.  Musculoskeletal: Negative for falls.  Skin: Negative for rash.  Neurological: Negative for loss of consciousness and headaches.  Endo/Heme/Allergies: Does not bruise/bleed easily.  Psychiatric/Behavioral: Negative for depression and memory loss.       Objective:    Physical Exam Vitals and nursing note reviewed.  Constitutional:      General: Joseph Church is not in acute distress.    Appearance: Normal appearance. Joseph Church is obese. [facility] is not ill-appearing, toxic-appearing or diaphoretic.  HENT:     Head: Normocephalic and atraumatic.     Right Ear: Tympanic membrane and ear canal normal. There is no impacted cerumen.     Left Ear: Tympanic membrane and ear canal normal. There is no impacted cerumen.     Nose: Nose normal.     Mouth/Throat:     Mouth: Mucous membranes are moist.  Eyes:     Extraocular Movements: Extraocular movements intact.  Cardiovascular:     Rate and Rhythm: Normal rate and regular rhythm.  Pulmonary:     Effort: Pulmonary effort is normal.     Breath sounds: Normal breath sounds.  Musculoskeletal:        General: No swelling. Normal range of motion.     Cervical back: Normal range of motion.  Skin:    General: Skin is warm and dry.  Neurological:     General: No focal deficit present.     Mental Status: Joseph Church is alert.  Psychiatric:        Mood and Affect: Mood normal.        Behavior: Behavior normal.        Thought Content: Thought content normal.        Judgment: Judgment normal.     BP 104/68 (BP Location: Left Arm, Patient Position: Sitting, Cuff Size: Normal)    Pulse (!) 56   Temp (!) 96.1 F (35.6 C) (Temporal)   Ht 6\' 1"  (1.854 m)   Wt 254 lb 3.2 oz (115.3 kg)   SpO2 96%   BMI 33.54 kg/m  Wt Readings from Last 3 Encounters:  09/06/19 254 lb 3.2 oz (115.3 kg)  03/30/19 252 lb (114.3 kg)  08/31/18 251 lb 6.4 oz (114 kg)     Lab Results  Component Value Date   WBC 8.0 08/31/2018   HGB 16.7 08/31/2018   HCT 47.4 08/31/2018   PLT 210.0 08/31/2018   GLUCOSE 108 (H) 08/31/2018   CHOL 161 08/31/2018   TRIG 219.0 (H) 08/31/2018   HDL 46.90 08/31/2018   LDLDIRECT 95.0 08/31/2018   LDLCALC 65 08/25/2017   ALT 27 08/31/2018   AST 22 08/31/2018   NA 139 08/31/2018   K 4.5 08/31/2018   CL 101 08/31/2018   CREATININE 1.08 08/31/2018   BUN 21 08/31/2018   CO2 29 08/31/2018   TSH 2.98 07/11/2015   PSA 0.53 08/31/2018   HGBA1C 5.5 08/31/2018    Lab Results  Component Value Date   TSH 2.98 07/11/2015   Lab Results  Component Value Date   WBC 8.0 08/31/2018   HGB 16.7 08/31/2018   HCT 47.4 08/31/2018   MCV 90.4 08/31/2018   PLT 210.0 08/31/2018   Lab Results  Component Value Date   NA 139 08/31/2018  K 4.5 08/31/2018   CO2 29 08/31/2018   GLUCOSE 108 (H) 08/31/2018   BUN 21 08/31/2018   CREATININE 1.08 08/31/2018   BILITOT 1.0 08/31/2018   ALKPHOS 67 08/31/2018   AST 22 08/31/2018   ALT 27 08/31/2018   PROT 6.9 08/31/2018   ALBUMIN 4.6 08/31/2018   CALCIUM 10.0 08/31/2018   ANIONGAP 11 03/26/2018   GFR 74.61 08/31/2018   Lab Results  Component Value Date   CHOL 161 08/31/2018   Lab Results  Component Value Date   HDL 46.90 08/31/2018   Lab Results  Component Value Date   LDLCALC 65 08/25/2017   Lab Results  Component Value Date   TRIG 219.0 (H) 08/31/2018   Lab Results  Component Value Date   CHOLHDL 3 08/31/2018   Lab Results  Component Value Date   HGBA1C 5.5 08/31/2018       Assessment & Plan:   Problem List Items Addressed This Visit      Active Problems   GERD (gastroesophageal reflux  disease)    Well controlled on current rxs. No changes made.      Hyperlipidemia    Tolerating statin, encouraged heart healthy diet, avoid trans fats, minimize simple carbs and saturated fats. Increase exercise as tolerated       Relevant Medications   metoprolol tartrate (LOPRESSOR) 50 MG tablet   olmesartan (BENICAR) 20 MG tablet   Other Relevant Orders   Well man- non DM- CBC   Well man- non DM- CMET   Well man- non DM- lipid   Hemoglobin A1c   Hypertension    Well controlled, no changes to meds. Encouraged heart healthy diet such as the DASH diet and exercise as tolerated.        Relevant Medications   metoprolol tartrate (LOPRESSOR) 50 MG tablet   olmesartan (BENICAR) 20 MG tablet   Other Relevant Orders   Well man- non DM- CBC   Well man- non DM- CMET   Well man- non DM- lipid   Hemoglobin A1c   Insomnia     The problem of recurrent insomnia is discussed. Avoidance of caffeine sources is strongly encouraged. Sleep hygiene issues are reviewed. The use of sedative hypnotics for temporary relief is appropriate; we discussed the addictive nature of these drugs, continue sonata for prn use of a hypnotic is given, to use no more than 3 times per week for 2-3 weeks.        Depression    Well controlled without rx.  Depression screen [facility] 2/9 09/06/2019 08/31/2018 08/25/2017  Decreased Interest 0 0 0  Down, Depressed, Hopeless 0 0 0  PHQ - 2 Score 0 0 0  Altered sleeping - 2 0  Tired, decreased energy - 1 0  Change in appetite - 1 0  Feeling bad or failure about yourself  - 0 0  Trouble concentrating - 0 0  Moving slowly or fidgety/restless - 0 0  Suicidal thoughts - 0 0  PHQ-9 Score - 4 0  Difficult doing work/chores - Somewhat difficult -         Visit for well man health check - Primary    Reviewed preventive care protocols, scheduled due services, and updated immunizations Discussed nutrition, exercise, diet, and healthy lifestyle.        Other Visit  Diagnoses    Screening for prostate cancer       Relevant Orders   Well man- non DM- PSA   Elevated glucose  Relevant Orders   Well man- non DM- CMET   Well man- non DM- lipid   Hemoglobin A1c      I have discontinued Pierce Crane. Tiley "Chuck"'s famotidine and Fluoxetine HCl (PMDD). I have also changed his baclofen, ibuprofen, metoprolol tartrate, and olmesartan. Additionally, I am having him maintain his zaleplon, rosuvastatin, omeprazole, and benzonatate.  Meds ordered this encounter  Medications  . baclofen (LIORESAL) 10 MG tablet    Sig: Take 1 tablet (10 mg total) by mouth 3 (three) times daily.    Dispense:  90 tablet    Refill:  5  . benzonatate (TESSALON) 100 MG capsule    Sig: Take 1 capsule (100 mg total) by mouth 2 (two) times daily as needed for cough.    Dispense:  20 capsule    Refill:  5  . ibuprofen (ADVIL) 800 MG tablet    Sig: Take 1 tablet (800 mg total) by mouth every 8 (eight) hours as needed.    Dispense:  90 tablet    Refill:  11  . metoprolol tartrate (LOPRESSOR) 50 MG tablet    Sig: Take 1 tablet (50 mg total) by mouth 2 (two) times daily.    Dispense:  180 tablet    Refill:  1  . olmesartan (BENICAR) 20 MG tablet    Sig: Take 1 tablet (20 mg total) by mouth daily.    Dispense:  90 tablet    Refill:  1    This visit occurred during the SARS-CoV-2 public health emergency.  Safety protocols were in place, including screening questions prior to the visit, additional usage of staff PPE, and extensive cleaning of exam room while observing appropriate contact time as indicated for disinfecting solutions.    Arnette Norris, MD

## 2019-09-05 NOTE — Assessment & Plan Note (Signed)
Well controlled, no changes to meds. Encouraged heart healthy diet such as the DASH diet and exercise as tolerated.  °

## 2019-09-05 NOTE — Assessment & Plan Note (Signed)
Tolerating statin, encouraged heart healthy diet, avoid trans fats, minimize simple carbs and saturated fats. Increase exercise as tolerated 

## 2019-09-05 NOTE — Assessment & Plan Note (Signed)
Reviewed preventive care protocols, scheduled due services, and updated immunizations Discussed nutrition, exercise, diet, and healthy lifestyle.  

## 2019-09-06 ENCOUNTER — Ambulatory Visit (INDEPENDENT_AMBULATORY_CARE_PROVIDER_SITE_OTHER): Payer: Federal, State, Local not specified - PPO | Admitting: Family Medicine

## 2019-09-06 ENCOUNTER — Encounter: Payer: Self-pay | Admitting: Family Medicine

## 2019-09-06 ENCOUNTER — Other Ambulatory Visit: Payer: Self-pay

## 2019-09-06 VITALS — BP 104/68 | HR 56 | Temp 96.1°F | Ht 73.0 in | Wt 254.2 lb

## 2019-09-06 DIAGNOSIS — E785 Hyperlipidemia, unspecified: Secondary | ICD-10-CM | POA: Diagnosis not present

## 2019-09-06 DIAGNOSIS — Z125 Encounter for screening for malignant neoplasm of prostate: Secondary | ICD-10-CM

## 2019-09-06 DIAGNOSIS — F32A Depression, unspecified: Secondary | ICD-10-CM

## 2019-09-06 DIAGNOSIS — Z0000 Encounter for general adult medical examination without abnormal findings: Secondary | ICD-10-CM

## 2019-09-06 DIAGNOSIS — G4700 Insomnia, unspecified: Secondary | ICD-10-CM

## 2019-09-06 DIAGNOSIS — R7309 Other abnormal glucose: Secondary | ICD-10-CM | POA: Diagnosis not present

## 2019-09-06 DIAGNOSIS — F329 Major depressive disorder, single episode, unspecified: Secondary | ICD-10-CM | POA: Diagnosis not present

## 2019-09-06 DIAGNOSIS — K219 Gastro-esophageal reflux disease without esophagitis: Secondary | ICD-10-CM

## 2019-09-06 DIAGNOSIS — I10 Essential (primary) hypertension: Secondary | ICD-10-CM | POA: Diagnosis not present

## 2019-09-06 LAB — COMPREHENSIVE METABOLIC PANEL
ALT: 20 U/L (ref 0–53)
AST: 20 U/L (ref 0–37)
Albumin: 4.3 g/dL (ref 3.5–5.2)
Alkaline Phosphatase: 64 U/L (ref 39–117)
BUN: 19 mg/dL (ref 6–23)
CO2: 30 mEq/L (ref 19–32)
Calcium: 9.6 mg/dL (ref 8.4–10.5)
Chloride: 102 mEq/L (ref 96–112)
Creatinine, Ser: 0.99 mg/dL (ref 0.40–1.50)
GFR: 77.34 mL/min (ref >60.00)
Glucose, Bld: 116 mg/dL — ABNORMAL HIGH (ref 70–99)
Potassium: 4.3 mEq/L (ref 3.5–5.1)
Sodium: 138 mEq/L (ref 135–145)
Total Bilirubin: 0.7 mg/dL (ref 0.2–1.2)
Total Protein: 6.6 g/dL (ref 6.0–8.3)

## 2019-09-06 LAB — CBC WITH DIFFERENTIAL/PLATELET
Basophils Absolute: 0.1 10*3/uL (ref 0.0–0.1)
Basophils Relative: 0.8 % (ref 0.0–3.0)
Eosinophils Absolute: 0.2 10*3/uL (ref 0.0–0.7)
Eosinophils Relative: 2.6 % (ref 0.0–5.0)
HCT: 44.2 % (ref 39.0–52.0)
Hemoglobin: 15.1 g/dL (ref 13.0–17.0)
Lymphocytes Relative: 28.1 % (ref 12.0–46.0)
Lymphs Abs: 1.9 10*3/uL (ref 0.7–4.0)
MCHC: 34.1 g/dL (ref 30.0–36.0)
MCV: 86.7 fl (ref 78.0–100.0)
Monocytes Absolute: 0.6 10*3/uL (ref 0.1–1.0)
Monocytes Relative: 8.8 % (ref 3.0–12.0)
Neutro Abs: 4 10*3/uL (ref 1.4–7.7)
Neutrophils Relative %: 59.7 % (ref 43.0–77.0)
Platelets: 204 10*3/uL (ref 150.0–400.0)
RBC: 5.1 Mil/uL (ref 4.22–5.81)
RDW: 13.6 % (ref 11.5–15.5)
WBC: 6.7 10*3/uL (ref 4.0–10.5)

## 2019-09-06 LAB — LIPID PANEL
Cholesterol: 147 mg/dL (ref 0–200)
HDL: 38.7 mg/dL — ABNORMAL LOW (ref >39.00)
LDL Cholesterol: 73 mg/dL (ref 0–99)
NonHDL: 108.56
Total CHOL/HDL Ratio: 4
Triglycerides: 177 mg/dL — ABNORMAL HIGH (ref 0.0–149.0)
VLDL: 35.4 mg/dL (ref 0.0–40.0)

## 2019-09-06 LAB — HEMOGLOBIN A1C: Hgb A1c MFr Bld: 6 % (ref 4.6–6.5)

## 2019-09-06 LAB — PSA: PSA: 0.52 ng/mL (ref 0.10–4.00)

## 2019-09-06 MED ORDER — IBUPROFEN 800 MG PO TABS: 800.0000 mg | ORAL_TABLET | Freq: Three times a day (TID) | ORAL | 11 refills | Status: AC | PRN

## 2019-09-06 MED ORDER — BENZONATATE 100 MG PO CAPS: 100.0000 mg | ORAL_CAPSULE | Freq: Two times a day (BID) | ORAL | 5 refills | Status: DC | PRN

## 2019-09-06 MED ORDER — METOPROLOL TARTRATE 50 MG PO TABS: 50.0000 mg | ORAL_TABLET | Freq: Two times a day (BID) | ORAL | 1 refills | Status: DC

## 2019-09-06 MED ORDER — OLMESARTAN MEDOXOMIL 20 MG PO TABS: 20.0000 mg | ORAL_TABLET | Freq: Every day | ORAL | 1 refills | Status: DC

## 2019-09-06 MED ORDER — BACLOFEN 10 MG PO TABS: 10.0000 mg | ORAL_TABLET | Freq: Three times a day (TID) | ORAL | 5 refills | Status: AC

## 2019-09-06 NOTE — Patient Instructions (Addendum)
Great to see you. I will call you with your lab results from today and you can view them online.    DASH Eating Plan DASH stands for "Dietary Approaches to Stop Hypertension." The DASH eating plan is a healthy eating plan that has been shown to reduce high blood pressure (hypertension). It may also reduce your risk for type 2 diabetes, heart disease, and stroke. The DASH eating plan may also help with weight loss. What are tips for following this plan?  General guidelines  Avoid eating more than 2,300 mg (milligrams) of salt (sodium) a day. If you have hypertension, you may need to reduce your sodium intake to 1,500 mg a day.  Limit alcohol intake to no more than 1 drink a day for nonpregnant women and 2 drinks a day for men. One drink equals 12 oz of beer, 5 oz of wine, or 1 oz of hard liquor.  Work with your health care provider to maintain a healthy body weight or to lose weight. Ask what an ideal weight is for you.  Get at least 30 minutes of exercise that causes your heart to beat faster (aerobic exercise) most days of the week. Activities may include walking, swimming, or biking.  Work with your health care provider or diet and nutrition specialist (dietitian) to adjust your eating plan to your individual calorie needs. Reading food labels   Check food labels for the amount of sodium per serving. Choose foods with less than 5 percent of the Daily Value of sodium. Generally, foods with less than 300 mg of sodium per serving fit into this eating plan.  To find whole grains, look for the word "whole" as the first word in the ingredient list. Shopping  Buy products labeled as "low-sodium" or "no salt added."  Buy fresh foods. Avoid canned foods and premade or frozen meals. Cooking  Avoid adding salt when cooking. Use salt-free seasonings or herbs instead of table salt or sea salt. Check with your health care provider or pharmacist before using salt substitutes.  Do not fry foods.  Cook foods using healthy methods such as baking, boiling, grilling, and broiling instead.  Cook with heart-healthy oils, such as olive, canola, soybean, or sunflower oil. Meal planning  Eat a balanced diet that includes: ? 5 or more servings of fruits and vegetables each day. At each meal, try to fill half of your plate with fruits and vegetables. ? Up to 6-8 servings of whole grains each day. ? Less than 6 oz of lean meat, poultry, or fish each day. A 3-oz serving of meat is about the same size as a deck of cards. One egg equals 1 oz. ? 2 servings of low-fat dairy each day. ? A serving of nuts, seeds, or beans 5 times each week. ? Heart-healthy fats. Healthy fats called Omega-3 fatty acids are found in foods such as flaxseeds and coldwater fish, like sardines, salmon, and mackerel.  Limit how much you eat of the following: ? Canned or prepackaged foods. ? Food that is high in trans fat, such as fried foods. ? Food that is high in saturated fat, such as fatty meat. ? Sweets, desserts, sugary drinks, and other foods with added sugar. ? Full-fat dairy products.  Do not salt foods before eating.  Try to eat at least 2 vegetarian meals each week.  Eat more home-cooked food and less restaurant, buffet, and fast food.  When eating at a restaurant, ask that your food be prepared with less salt  or no salt, if possible. What foods are recommended? The items listed may not be a complete list. Talk with your dietitian about what dietary choices are best for you. Grains Whole-grain or whole-wheat bread. Whole-grain or whole-wheat pasta. Brown rice. [place]. Bulgur. Whole-grain and low-sodium cereals. Pita bread. Low-fat, low-sodium crackers. Whole-wheat flour tortillas. Vegetables Fresh or frozen vegetables (raw, steamed, roasted, or grilled). Low-sodium or reduced-sodium tomato and vegetable juice. Low-sodium or reduced-sodium tomato sauce and tomato paste. Low-sodium or reduced-sodium  canned vegetables. Fruits All fresh, dried, or frozen fruit. Canned fruit in natural juice (without added sugar). Meat and other protein foods Skinless chicken or [place]. Ground chicken or [place]. Pork with fat trimmed off. Fish and seafood. Egg whites. Dried beans, peas, or lentils. Unsalted nuts, nut butters, and seeds. Unsalted canned beans. Lean cuts of beef with fat trimmed off. Low-sodium, lean deli meat. Dairy Low-fat (1%) or fat-free (skim) milk. Fat-free, low-fat, or reduced-fat cheeses. Nonfat, low-sodium ricotta or cottage cheese. Low-fat or nonfat yogurt. Low-fat, low-sodium cheese. Fats and oils Soft margarine without trans fats. Vegetable oil. Low-fat, reduced-fat, or light mayonnaise and salad dressings (reduced-sodium). Canola, safflower, olive, soybean, and sunflower oils. Avocado. Seasoning and other foods Herbs. Spices. Seasoning mixes without salt. Unsalted popcorn and pretzels. Fat-free sweets. What foods are not recommended? The items listed may not be a complete list. Talk with your dietitian about what dietary choices are best for you. Grains Baked goods made with fat, such as croissants, muffins, or some breads. Dry pasta or rice meal packs. Vegetables Creamed or fried vegetables. Vegetables in a cheese sauce. Regular canned vegetables (not low-sodium or reduced-sodium). Regular canned tomato sauce and paste (not low-sodium or reduced-sodium). Regular tomato and vegetable juice (not low-sodium or reduced-sodium). Angie Fava. Olives. Fruits Canned fruit in a light or heavy syrup. Fried fruit. Fruit in cream or butter sauce. Meat and other protein foods Fatty cuts of meat. Ribs. Fried meat. Berniece Salines. Sausage. Bologna and other processed lunch meats. Salami. Fatback. Hotdogs. Bratwurst. Salted nuts and seeds. Canned beans with added salt. Canned or smoked fish. Whole eggs or egg yolks. Chicken or [place] with skin. Dairy Whole or 2% milk, cream, and half-and-half. Whole or  full-fat cream cheese. Whole-fat or sweetened yogurt. Full-fat cheese. Nondairy creamers. Whipped toppings. Processed cheese and cheese spreads. Fats and oils Butter. Stick margarine. Lard. Shortening. Ghee. Bacon fat. Tropical oils, such as coconut, palm kernel, or palm oil. Seasoning and other foods Salted popcorn and pretzels. Onion salt, garlic salt, seasoned salt, table salt, and sea salt. Worcestershire sauce. Tartar sauce. Barbecue sauce. Teriyaki sauce. Soy sauce, including reduced-sodium. Steak sauce. Canned and packaged gravies. Fish sauce. Oyster sauce. Cocktail sauce. Horseradish that you find on the shelf. Ketchup. Mustard. Meat flavorings and tenderizers. Bouillon cubes. Hot sauce and Tabasco sauce. Premade or packaged marinades. Premade or packaged taco seasonings. Relishes. Regular salad dressings. Where to find more information:  National Heart, Lung, and [place]: https://wilson-eaton.com/  American Heart Association: www.heart.org Summary  The DASH eating plan is a healthy eating plan that has been shown to reduce high blood pressure (hypertension). It may also reduce your risk for type 2 diabetes, heart disease, and stroke.  With the DASH eating plan, you should limit salt (sodium) intake to 2,300 mg a day. If you have hypertension, you may need to reduce your sodium intake to 1,500 mg a day.  When on the DASH eating plan, aim to eat more fresh fruits and vegetables, whole grains, lean proteins, low-fat dairy,  and heart-healthy fats.  Work with your health care provider or diet and nutrition specialist (dietitian) to adjust your eating plan to your individual calorie needs. This information is not intended to replace advice given to you by your health care provider. Make sure you discuss any questions you have with your health care provider. Document Released: 08/27/2011 Document Revised: 08/20/2017 Document Reviewed: 08/31/2016 Elsevier Patient Education  2020 [employer].

## 2019-09-06 NOTE — Assessment & Plan Note (Signed)
  The problem of recurrent insomnia is discussed. Avoidance of caffeine sources is strongly encouraged. Sleep hygiene issues are reviewed. The use of sedative hypnotics for temporary relief is appropriate; we discussed the addictive nature of these drugs, continue sonata for prn use of a hypnotic is given, to use no more than 3 times per week for 2-3 weeks.

## 2019-09-06 NOTE — Assessment & Plan Note (Signed)
Well controlled without rx.  Depression screen [facility] 2/9 09/06/2019 08/31/2018 08/25/2017  Decreased Interest 0 0 0  Down, Depressed, Hopeless 0 0 0  PHQ - 2 Score 0 0 0  Altered sleeping - 2 0  Tired, decreased energy - 1 0  Change in appetite - 1 0  Feeling bad or failure about yourself  - 0 0  Trouble concentrating - 0 0  Moving slowly or fidgety/restless - 0 0  Suicidal thoughts - 0 0  PHQ-9 Score - 4 0  Difficult doing work/chores - Somewhat difficult -

## 2019-09-06 NOTE — Assessment & Plan Note (Signed)
Well controlled on current rxs. No changes made. 

## 2019-09-07 ENCOUNTER — Encounter: Payer: Self-pay | Admitting: Family Medicine

## 2019-09-07 ENCOUNTER — Ambulatory Visit: Payer: Federal, State, Local not specified - PPO | Admitting: Family Medicine

## 2019-09-07 ENCOUNTER — Ambulatory Visit: Payer: Self-pay

## 2019-09-07 VITALS — BP 116/80 | HR 63 | Ht 73.0 in | Wt 247.0 lb

## 2019-09-07 DIAGNOSIS — M65312 Trigger thumb, left thumb: Secondary | ICD-10-CM

## 2019-09-07 DIAGNOSIS — S5331XD Traumatic rupture of right ulnar collateral ligament, subsequent encounter: Secondary | ICD-10-CM | POA: Diagnosis not present

## 2019-09-07 DIAGNOSIS — G8929 Other chronic pain: Secondary | ICD-10-CM

## 2019-09-07 DIAGNOSIS — M79645 Pain in left finger(s): Secondary | ICD-10-CM

## 2019-09-07 NOTE — Progress Notes (Signed)
[facility] [address] [place], [place] [address] Phone: [phone] Subjective:   I, Joseph Church, am serving as a scribe for Dr. Hulan Saas.  I'm seeing this patient by the request  of:    CC: Bilateral thumb pain  [id number]   03/30/2019 Right knee pain.  Likely patellofemoral arthritis.  X-rays ordered today.  Mild exercise, topical anti-inflammatories.  Which activities to do which wants to avoid.  Follow-up again in 4 to 6 weeks  Right heel pain.  Seems to be a tear of the plantar fascia, home exercises given, discussed over-the-counter orthotics and proper shoes.  Follow-up again in 4-6 weeks  Shows been ruptured previously.  Patient has been doing conservative therapy.  Has responded well to simvastatin injection previously.  Continue  Patient given injection today.  Tolerated the procedure well.  Discussed that the triggering may get worse over the course the next 48 hours and then should improve.  Discussed icing regimen.  May need repeat every 9 to 18 months.   Update 09/07/2019 Joseph Church is a 58 y.o. male coming in with complaint of bilateral thumb pain. Patient was given injection in left hand for trigger finger. Patient states he has constant pain.  Patient states that the left pain is kind of more than a chronic pain.  Right-sided has more the laxity with the ECU pain comes and goes.  Both of them are making daily activities fairly difficult.     Past Medical History:  Diagnosis Date  . GERD (gastroesophageal reflux disease)   . Hyperlipidemia   . Hypertension    Past Surgical History:  Procedure Laterality Date  . [facility];[place]  . CHOLECYSTECTOMY, LAPAROSCOPIC  2000  . keratotomy  1995  . TONSILLECTOMY AND ADENOIDECTOMY  1960's  . VASECTOMY  1987  . VASECTOMY REVERSAL  1994   Social History   Socioeconomic History  . Marital status: Married     Spouse name: Not on file  . Number of children: 5  . Years of education: Not on file  . Highest education level: Not on file  Occupational History  . Occupation: PA  Tobacco Use  . Smoking status: Never Smoker  . Smokeless tobacco: Never Used  Substance and Sexual Activity  . Alcohol use: Yes    Alcohol/week: 10.0 standard drinks    Types: 10 Standard drinks or equivalent per week  . Drug use: No  . Sexual activity: Not on file  Other Topics Concern  . Not on file  Social History Narrative   28 [place]   Divorced 3 times   Has 5 children- two teenagers that live in [place] Determinants of Health   Financial Resource Strain:   . Difficulty of Paying Living Expenses: Not on file  Food Insecurity:   . Worried About Charity fundraiser in the Last Year: Not on file  . Ran Out of Food in the Last Year: Not on file  Transportation Needs:   . Lack of Transportation (Medical): Not on file  . Lack of Transportation (Non-Medical): Not on file  Physical Activity:   . Days of Exercise per Week: Not on file  . Minutes of Exercise per Session: Not on file  Stress:   . Feeling of Stress : Not on file  Social Connections:   . Frequency of Communication with Friends and Family: Not on file  . Frequency  of Social Gatherings with Friends and Family: Not on file  . Attends Religious Services: Not on file  . Active Member of Clubs or Organizations: Not on file  . Attends Archivist Meetings: Not on file  . Marital Status: Not on file   Allergies  Allergen Reactions  . Lisinopril Cough   Family History  Problem Relation Age of Onset  . Cancer Mother   . Cancer Father        lymphoma  . Hypertension Father   . Hyperlipidemia Father   . Heart disease Father 39       CABG x 4     Current Outpatient Medications (Cardiovascular):  .  metoprolol tartrate (LOPRESSOR) 50 MG tablet, Take 1 tablet (50 mg total) by mouth 2 (two) times daily. Marland Kitchen   olmesartan (BENICAR) 20 MG tablet, Take 1 tablet (20 mg total) by mouth daily. .  rosuvastatin (CRESTOR) 40 MG tablet, TAKE 1 TABLET BY MOUTH EVERYDAY AT BEDTIME  Current Outpatient Medications (Respiratory):  .  benzonatate (TESSALON) 100 MG capsule, Take 1 capsule (100 mg total) by mouth 2 (two) times daily as needed for cough.  Current Outpatient Medications (Analgesics):  .  ibuprofen (ADVIL) 800 MG tablet, Take 1 tablet (800 mg total) by mouth every 8 (eight) hours as needed.   Current Outpatient Medications (Other):  .  baclofen (LIORESAL) 10 MG tablet, Take 1 tablet (10 mg total) by mouth 3 (three) times daily. Marland Kitchen  omeprazole (PRILOSEC) 20 MG capsule, TAKE 1 CAPSULE (20 MG TOTAL) BY MOUTH DAILY AS NEEDED. .  zaleplon (SONATA) 10 MG capsule, TAKE 1 CAPSULE BY MOUTH AT BEDTIME AS NEEDED SLEEP    Past medical history, social, surgical and family history all reviewed in electronic medical record.  No pertanent information unless stated regarding to the chief complaint.   Review of Systems:  No headache, visual changes, nausea, vomiting, diarrhea, constipation, dizziness, abdominal pain, skin rash, fevers, chills, night sweats, weight loss, swollen lymph nodes, body aches, joint swelling, muscle aches, chest pain, shortness of breath, mood changes.   Objective  There were no vitals taken for this visit. Systems examined below as of    General: No apparent distress alert and oriented x3 mood and affect normal, dressed appropriately.  HEENT: Pupils equal, extraocular movements intact  Respiratory: Patient's speak in full sentences and does not appear short of breath  Cardiovascular: No lower extremity edema, non tender, no erythema  Skin: Warm dry intact with no signs of infection or rash on extremities or on axial skeleton.  Abdomen: Soft nontender  Neuro: Cranial nerves II through XII are intact, neurovascularly intact in all extremities with 2+ DTRs and 2+ pulses.  Lymph: No  lymphadenopathy of posterior or anterior cervical chain or axillae bilaterally.  Gait normal with good balance and coordination.  MSK:  Non tender with full range of motion and good stability and symmetric strength and tone of shoulders, elbows, wrist, hip, knee and ankles bilaterally.   Left hand exam shows the patient does have a trigger nodule at the A2 pulley as well as a very small one at the A1 pulley.  Both tender but severely more tender at the A2.  Triggering noted today.  Contralateral thumb shows the patient does have laxity of the UCL noted.  Mild positive grind test of the [facility] joint  Procedure: Real-time Ultrasound Guided Injection of the right [facility] Device: GE Logiq Q7 Ultrasound guided injection is preferred based studies that show increased duration,  increased effect, greater accuracy, decreased procedural pain, increased response rate, and decreased cost with ultrasound guided versus blind injection.  Verbal informed consent obtained.  Time-out conducted.  Noted no overlying erythema, induration, or other signs of local infection.  Skin prepped in a sterile fashion.  Local anesthesia: Topical Ethyl chloride.  With sterile technique and under real time ultrasound guidance: With a 25-gauge half inch needle injected with 0.5 cc of 0.5% Marcaine and 0.5 cc Kenalog 40 mg/mL Completed without difficulty  Pain immediately resolved suggesting accurate placement of the medication.  Advised to call if fevers/chills, erythema, induration, drainage, or persistent bleeding.  Images permanently stored and available for review in the ultrasound unit.  Impression: Technically successful ultrasound guided injection.  Procedure: Real-time Ultrasound Guided Injection of left flexor tendon sheath Device: GE Logiq Q7 Ultrasound guided injection is preferred based studies that show increased duration, increased effect, greater accuracy, decreased procedural pain, increased response rate, and  decreased cost with ultrasound guided versus blind injection.  Verbal informed consent obtained.  Time-out conducted.  Noted no overlying erythema, induration, or other signs of local infection.  Skin prepped in a sterile fashion.  Local anesthesia: Topical Ethyl chloride.  With sterile technique and under real time ultrasound guidance: With a 25-gauge half inch needle injected with 0.5 cc of 0.5% Marcaine and 0.5 cc of Kenalog 40 mg/mL in the first flexor tendon sheath at the A2 pulley Completed without difficulty  Pain immediately resolved suggesting accurate placement of the medication.  Advised to call if fevers/chills, erythema, induration, drainage, or persistent bleeding.  Images permanently stored and available for review in the ultrasound unit.  Impression: Technically successful ultrasound guided injection.   Impression and Recommendations:     This case required medical decision making of moderate complexity. The above documentation has been reviewed and is accurate and complete Lyndal Pulley, DO       Note: This dictation was prepared with Dragon dictation along with smaller phrase technology. Any transcriptional errors that result from this process are unintentional.

## 2019-09-07 NOTE — Assessment & Plan Note (Signed)
Repeat injection given again today.  We discussed if serial injections are necessary either surgical intervention or PRP may be necessary.  Patient is in agreement with the plan.

## 2019-09-07 NOTE — Assessment & Plan Note (Signed)
Seen on MRI and, underwent conservative, injection given today.  If continuing to have difficulty will need to consider the possibility

## 2019-09-07 NOTE — Patient Instructions (Addendum)
  [address], [address] [place], [place] [address] Phone [phone]  Happy Holidays!

## 2019-10-11 ENCOUNTER — Ambulatory Visit: Payer: Federal, State, Local not specified - PPO | Admitting: Family Medicine

## 2019-10-11 NOTE — Progress Notes (Deleted)
[place] Phone: [phone] Subjective:    I'm seeing this patient by the request  of:  Lucille Passy, MD  CC:   [id number]  Joseph Church is a 59 y.o. Church coming in with complaint of ***  Onset-  Location Duration-  Character- Aggravating factors- Reliving factors-  Therapies tried-  Severity-     Past Medical History:  Diagnosis Date  . GERD (gastroesophageal reflux disease)   . Hyperlipidemia   . Hypertension    Past Surgical History:  Procedure Laterality Date  . [place]  . CHOLECYSTECTOMY, LAPAROSCOPIC  2000  . keratotomy  1995  . TONSILLECTOMY AND ADENOIDECTOMY  1960's  . VASECTOMY  1987  . VASECTOMY REVERSAL  1994   Social History   Socioeconomic History  . Marital status: Married    Spouse name: Not on file  . Number of children: 5  . Years of education: Not on file  . Highest education level: Not on file  Occupational History  . Occupation: PA  Tobacco Use  . Smoking status: Never Smoker  . Smokeless tobacco: Never Used  Substance and Sexual Activity  . Alcohol use: Yes    Alcohol/week: 10.0 standard drinks    Types: 10 Standard drinks or equivalent per week  . Drug use: No  . Sexual activity: Not on file  Other Topics Concern  . Not on file  Social History Narrative   31 [employer]   Divorced 3 times   Has 5 children- two teenagers that live in [place] Determinants of Health   Financial Resource Strain:   . Difficulty of Paying Living Expenses: Not on file  Food Insecurity:   . Worried About Charity fundraiser in the Last Year: Not on file  . Ran Out of Food in the Last Year: Not on file  Transportation Needs:   . Lack of Transportation (Medical): Not on file  . Lack of Transportation (Non-Medical): Not on file  Physical Activity:   . Days of Exercise per Week:  Not on file  . Minutes of Exercise per Session: Not on file  Stress:   . Feeling of Stress : Not on file  Social Connections:   . Frequency of Communication with Friends and Family: Not on file  . Frequency of Social Gatherings with Friends and Family: Not on file  . Attends Religious Services: Not on file  . Active Member of Clubs or Organizations: Not on file  . Attends Archivist Meetings: Not on file  . Marital Status: Not on file   Allergies  Allergen Reactions  . Lisinopril Cough   Family History  Problem Relation Age of Onset  . Cancer Mother   . Cancer Father        lymphoma  . Hypertension Father   . Hyperlipidemia Father   . Heart disease Father 76       CABG x 4     Current Outpatient Medications (Cardiovascular):  .  metoprolol tartrate (LOPRESSOR) 50 MG tablet, Take 1 tablet (50 mg total) by mouth 2 (two) times daily. Marland Kitchen  olmesartan (BENICAR) 20 MG tablet, Take 1 tablet (20 mg total) by mouth daily. .  rosuvastatin (CRESTOR) 40 MG tablet, TAKE 1 TABLET BY MOUTH EVERYDAY AT BEDTIME  Current Outpatient Medications (Respiratory):  .  benzonatate (TESSALON) 100 MG capsule, Take 1 capsule (  100 mg total) by mouth 2 (two) times daily as needed for cough.  Current Outpatient Medications (Analgesics):  .  ibuprofen (ADVIL) 800 MG tablet, Take 1 tablet (800 mg total) by mouth every 8 (eight) hours as needed.   Current Outpatient Medications (Other):  .  baclofen (LIORESAL) 10 MG tablet, Take 1 tablet (10 mg total) by mouth 3 (three) times daily. Marland Kitchen  omeprazole (PRILOSEC) 20 MG capsule, TAKE 1 CAPSULE (20 MG TOTAL) BY MOUTH DAILY AS NEEDED. .  zaleplon (SONATA) 10 MG capsule, TAKE 1 CAPSULE BY MOUTH AT BEDTIME AS NEEDED SLEEP    Past medical history, social, surgical and family history all reviewed in electronic medical record.  No pertanent information unless stated regarding to the chief complaint.   Review of Systems:  No headache, visual changes,  nausea, vomiting, diarrhea, constipation, dizziness, abdominal pain, skin rash, fevers, chills, night sweats, weight loss, swollen lymph nodes, body aches, joint swelling, chest pain, shortness of breath, mood changes. POSITIVE muscle aches  Objective  There were no vitals taken for this visit.   General: No apparent distress alert and oriented x3 mood and affect normal, dressed appropriately.  HEENT: Pupils equal, extraocular movements intact  Respiratory: Patient's speak in full sentences and does not appear short of breath  Cardiovascular: No lower extremity edema, non tender, no erythema  Skin: Warm dry intact with no signs of infection or rash on extremities or on axial skeleton.  Abdomen: Soft nontender  Neuro: Cranial nerves II through XII are intact, neurovascularly intact in all extremities with 2+ DTRs and 2+ pulses.  Lymph: No lymphadenopathy of posterior or anterior cervical chain or axillae bilaterally.  Gait normal with good balance and coordination.  MSK:  Non tender with full range of motion and good stability and symmetric strength and tone of shoulders, elbows, wrist, hip, knee and ankles bilaterally.     Impression and Recommendations:     This case required medical decision making of moderate complexity. The above documentation has been reviewed and is accurate and complete Lyndal Pulley, DO       Note: This dictation was prepared with Dragon dictation along with smaller phrase technology. Any transcriptional errors that result from this process are unintentional.

## 2019-10-18 ENCOUNTER — Encounter: Payer: Self-pay | Admitting: Family Medicine

## 2019-10-19 ENCOUNTER — Other Ambulatory Visit: Payer: Self-pay | Admitting: Family Medicine

## 2019-10-19 MED ORDER — SCOPOLAMINE 1 MG/3DAYS TD PT72: 1.0000 | MEDICATED_PATCH | TRANSDERMAL | 12 refills | Status: DC

## 2019-11-07 ENCOUNTER — Other Ambulatory Visit: Payer: Self-pay

## 2019-11-07 NOTE — Telephone Encounter (Signed)
MC-Plz see refill req from pharmacy for Zaleplon/last seen in December/thx dmf

## 2019-11-08 MED ORDER — ZALEPLON 10 MG PO CAPS: ORAL_CAPSULE | ORAL | 1 refills | Status: DC

## 2019-11-08 NOTE — Telephone Encounter (Signed)
Pt has appt with new PCP in 12/2019. Will refill x 2 mo so pt will have enough tab until that appt

## 2019-12-05 ENCOUNTER — Encounter: Payer: Self-pay | Admitting: Family Medicine

## 2019-12-07 DIAGNOSIS — I10 Essential (primary) hypertension: Secondary | ICD-10-CM | POA: Diagnosis not present

## 2019-12-07 DIAGNOSIS — E6609 Other obesity due to excess calories: Secondary | ICD-10-CM | POA: Diagnosis not present

## 2019-12-07 DIAGNOSIS — G4733 Obstructive sleep apnea (adult) (pediatric): Secondary | ICD-10-CM | POA: Diagnosis not present

## 2019-12-07 DIAGNOSIS — Z713 Dietary counseling and surveillance: Secondary | ICD-10-CM | POA: Diagnosis not present

## 2019-12-07 DIAGNOSIS — E785 Hyperlipidemia, unspecified: Secondary | ICD-10-CM | POA: Diagnosis not present

## 2019-12-07 DIAGNOSIS — Z723 Lack of physical exercise: Secondary | ICD-10-CM | POA: Diagnosis not present

## 2019-12-07 DIAGNOSIS — Z724 Inappropriate diet and eating habits: Secondary | ICD-10-CM | POA: Diagnosis not present

## 2019-12-30 DIAGNOSIS — I10 Essential (primary) hypertension: Secondary | ICD-10-CM | POA: Diagnosis not present

## 2019-12-30 DIAGNOSIS — E6609 Other obesity due to excess calories: Secondary | ICD-10-CM | POA: Diagnosis not present

## 2019-12-30 DIAGNOSIS — E785 Hyperlipidemia, unspecified: Secondary | ICD-10-CM | POA: Diagnosis not present

## 2019-12-30 DIAGNOSIS — Z713 Dietary counseling and surveillance: Secondary | ICD-10-CM | POA: Diagnosis not present

## 2019-12-30 DIAGNOSIS — G4733 Obstructive sleep apnea (adult) (pediatric): Secondary | ICD-10-CM | POA: Diagnosis not present

## 2019-12-30 DIAGNOSIS — Z723 Lack of physical exercise: Secondary | ICD-10-CM | POA: Diagnosis not present

## 2019-12-30 DIAGNOSIS — Z724 Inappropriate diet and eating habits: Secondary | ICD-10-CM | POA: Diagnosis not present

## 2020-01-01 ENCOUNTER — Other Ambulatory Visit: Payer: Self-pay

## 2020-01-01 MED ORDER — ROSUVASTATIN CALCIUM 40 MG PO TABS: ORAL_TABLET | ORAL | 0 refills | Status: DC

## 2020-01-09 ENCOUNTER — Encounter: Payer: Federal, State, Local not specified - PPO | Admitting: Family Medicine

## 2020-01-09 ENCOUNTER — Telehealth: Payer: Self-pay

## 2020-01-09 ENCOUNTER — Telehealth: Payer: Self-pay | Admitting: General Practice

## 2020-01-09 NOTE — Telephone Encounter (Signed)
[place] please advise,  Pt last ov with Dr. Deborra Medina was 09/06/2019 Scheduled Transfer Care visit with Waunita Schooner, MD in 01/24/20  Last refill 12.17.2020--from Dr. C--30 cap with 1 refill PMP checked last 3 pick up from [facility] for 30 caps  03.21.2021 03.17.2021 01.13.2021

## 2020-01-09 NOTE — Telephone Encounter (Signed)
Patient is calling and requesting a refill for [employer] sent to [facility] on [address] in [place]. Pt has a TOC appointment on 5/5 at the [facility] location. CB is [phone]

## 2020-01-09 NOTE — Telephone Encounter (Signed)
I have never seen this patient and refilled the med once in between previous PCP Dr. Deborra Medina leaving and his appt with new PCP at [facility]. Please forward to covering provider at [facility] office, as I am not able to refill.

## 2020-01-09 NOTE — Telephone Encounter (Signed)
Joseph Church please advise pt. Pt is in lmbo about a Sonata refill. Pt had an appointment today at [facility] that was cancelled by provider an he rescheduled for 01/21/2020. Hes a former Dr. Deborra Medina pt. But he was told to that since he was a pt of ours still we handled prescription. Pt going out of town and just needs enough to get him to his appointment 01/21/2020.

## 2020-01-09 NOTE — Telephone Encounter (Signed)
Dr. Loletha Grayer please advise.  Pt called in wanting a refill for Sonata even if its a 30 days supply. He's going out of town and needs it. He had an appointment today at [facility] but [name] was out and cancelled his appointments, pt sated he rescheduled appointment for 01/21/20, he just needed a refill to make it to his appointment. He said you helped him last time.

## 2020-01-09 NOTE — Telephone Encounter (Signed)
Larene Beach,   Can you please look in to this this further tomorrow with Dr. [name], who should be answering messages remotely as I will be out of the office? I am not sure that she will be comfortable refilling before seeing him. Will need to discuss with her.  The information regarding his fill and OV pattern is documented in the other phone note from today that has already been sent to Dr. [name] for review.   Our process is that we can set up an acute visit for controlled substance/medication review now before he goes out of town and keep his appointment for 01/21/20 to establish/transition of care, if he is needing medication refills before he is seen.  However, we should make him aware that we cannot guarantee that the new provider will continue him on the same controlled substances as that is up to the individual provider.  We can't say she will for certain refill or not but that would be up to the provider at the visit.   I will also copy Dr. [name] on this message.  Thanks to all involved for helping.

## 2020-01-09 NOTE — Telephone Encounter (Signed)
Dr. Janett Billow cody please help with pt request refill  Pt scheduled TOC with you on 01/24/2020.

## 2020-01-09 NOTE — Telephone Encounter (Signed)
Send refill request to provider he is transferring to. Thank you

## 2020-01-10 MED ORDER — ZALEPLON 10 MG PO CAPS: ORAL_CAPSULE | ORAL | 0 refills | Status: DC

## 2020-01-10 NOTE — Telephone Encounter (Signed)
See other message from Dr [name]

## 2020-01-10 NOTE — Telephone Encounter (Signed)
Advised pt the prescription could be sent to a pharmacy where he is going. Pt was very appreciative and said that does not need to be done. He reported he was headed to [place] to see his father. Talked with pt that my father also lives in [place]. Exchanged stories of living in  [place] with the weather. Pt was extremely appreciative. Advised if he changed his mind about the script to give this office a call. Pt verbalized understanding.

## 2020-01-10 NOTE — Telephone Encounter (Signed)
Since this is a controled substance request he will need an appointment.

## 2020-01-10 NOTE — Telephone Encounter (Signed)
Patient was advised. Patient states thank you but he already left and is out of town as of early this morning. I apologized to the patient for the delay and that Dr [name] was not able to see this message until this morning due to been out of the office. Patient understood and was not upset with our office but [facility] office. Patient states that basically [facility] office told him that he is not their patient anymore and figure it out yourself basically, could not help him. Patient states he tried for hours to get this taking care of with their office and this was bad patient care. I apologized to the patient again and that I would make sure our management was aware. Patient did mention that he was going to let Jossie Ng know but I am not sure if he still will since I called and advised that I would pass this along to my management. Patient was appreciative and stated he would pick up medication when he gets back in town.

## 2020-01-10 NOTE — Telephone Encounter (Signed)
Since he was planning an acute visit and got rescheduled, I sent in a 1 month supply. Of sleep medication as this was what was previously requested.   Please clarify if he will need refills on additional medications prior to appointment on 5/2.

## 2020-01-15 ENCOUNTER — Telehealth: Payer: Self-pay

## 2020-01-15 NOTE — Telephone Encounter (Signed)
PA for Zaleplon 10 mg approved for dates: 12/16/19-01/14/21. Pharmacy advised through fax.

## 2020-01-24 ENCOUNTER — Other Ambulatory Visit: Payer: Self-pay

## 2020-01-24 ENCOUNTER — Encounter: Payer: Self-pay | Admitting: Family Medicine

## 2020-01-24 ENCOUNTER — Ambulatory Visit: Payer: Federal, State, Local not specified - PPO | Admitting: Family Medicine

## 2020-01-24 VITALS — BP 98/62 | HR 62 | Temp 98.0°F | Resp 18 | Ht 72.5 in | Wt 228.5 lb

## 2020-01-24 DIAGNOSIS — K219 Gastro-esophageal reflux disease without esophagitis: Secondary | ICD-10-CM | POA: Diagnosis not present

## 2020-01-24 DIAGNOSIS — E785 Hyperlipidemia, unspecified: Secondary | ICD-10-CM

## 2020-01-24 DIAGNOSIS — G4700 Insomnia, unspecified: Secondary | ICD-10-CM

## 2020-01-24 DIAGNOSIS — I10 Essential (primary) hypertension: Secondary | ICD-10-CM

## 2020-01-24 DIAGNOSIS — M25551 Pain in right hip: Secondary | ICD-10-CM

## 2020-01-24 NOTE — Assessment & Plan Note (Signed)
Lower suspicion for joint pathology given ROM. Suspect bursitis vs muscle related injury. Advised trial of PT for evaluation and treatment.

## 2020-01-24 NOTE — Assessment & Plan Note (Signed)
BP low today and at home monitoring. In setting of weight loss, discussed decreasing metoprolol and stopping if able. Cont olmesartan.

## 2020-01-24 NOTE — Patient Instructions (Addendum)
#  Hypertension - decrease metoprolol to 25 mg twice daily - if blood pressure normal - can try stopping  #Reflux - try stopping omeprazole   I have placed a referral to a specialist for you. You should receive a phone call from the specialty office. Make sure your voicemail is not full and that if you are able to answer your phone to unknown or new numbers.   It may take up to 2 weeks to hear about the referral. If you do not hear anything in 2 weeks, please call our office and ask to speak with the referral coordinator.

## 2020-01-24 NOTE — Assessment & Plan Note (Signed)
Stable. Cont creastor

## 2020-01-24 NOTE — Assessment & Plan Note (Signed)
Encouraged trial of stopping - he has reduced alcohol and lost weight. If symptoms recur would advise GI evaluation given persistent need for medication

## 2020-01-24 NOTE — Progress Notes (Signed)
Subjective:     Joseph Church is a 59 y.o. male presenting for Transfer of Care (from Dr Deborra Medina) and Hip Pain (right. x months. Taking Ibuprofen, muscle relaxers, heating pad and stretching. No trauma to the area.)     HPI  #weight loss - has been going to weight loss management - has been phentermine - has had success - planning to stop in 1 month  #Hip pain - getting worse over the last few months - waking him up at night - couldn't find any comfortable position - no radiating symptoms - pain location: side/back of hip - achy during the day and worse at night - ibuprofen daily  - also muscle relaxers and heating pad  #Insomnia - feels like he needs medication to go to sleep - worried about morning after effect - avoids taking zaleplon - using melatonin in alternating ways  Review of Systems   Social History   Tobacco Use  Smoking Status Never Smoker  Smokeless Tobacco Never Used        Objective:    BP Readings from Last 3 Encounters:  01/24/20 98/62  09/07/19 116/80  09/06/19 104/68   Wt Readings from Last 3 Encounters:  01/24/20 228 lb 8 oz (103.6 kg)  09/07/19 247 lb (112 kg)  09/06/19 254 lb 3.2 oz (115.3 kg)    BP 98/62   Pulse 62   Temp 98 F (36.7 C)   Resp 18   Ht 6' 0.5" (1.842 m)   Wt 228 lb 8 oz (103.6 kg)   SpO2 97%   BMI 30.56 kg/m    Physical Exam Constitutional:      Appearance: Normal appearance. He is not ill-appearing or diaphoretic.  HENT:     Right Ear: External ear normal.     Left Ear: External ear normal.     Nose: Nose normal.  Eyes:     General: No scleral icterus.    Extraocular Movements: Extraocular movements intact.     Conjunctiva/sclera: Conjunctivae normal.  Cardiovascular:     Rate and Rhythm: Normal rate and regular rhythm.     Heart sounds: No murmur.  Pulmonary:     Effort: Pulmonary effort is normal. No respiratory distress.     Breath sounds: Normal breath sounds. No wheezing.    Musculoskeletal:     Cervical back: Neck supple.     Comments: Right Hip Inspection: no swelling Palpation: TTP along the lateral hip and the lateral glute area ROM: normal w/o pain FABER negative Strength: normal  Skin:    General: Skin is warm and dry.  Neurological:     Mental Status: He is alert. Mental status is at baseline.  Psychiatric:        Mood and Affect: Mood normal.           Assessment & Plan:   Problem List Items Addressed This Visit      Cardiovascular and Mediastinum   Hypertension - Primary    BP low today and at home monitoring. In setting of weight loss, discussed decreasing metoprolol and stopping if able. Cont olmesartan.         Digestive   GERD (gastroesophageal reflux disease)    Encouraged trial of stopping - he has reduced alcohol and lost weight. If symptoms recur would advise GI evaluation given persistent need for medication        Other   Hyperlipidemia    Stable. Cont creastor      Insomnia  Willing to try trazodone. Knows not to take zaleplon daily - he is alternating with melatonin. May try trazodone next time he is due for a refill, f/u in 3 months.       Right hip pain    Lower suspicion for joint pathology given ROM. Suspect bursitis vs muscle related injury. Advised trial of PT for evaluation and treatment.       Relevant Orders   Ambulatory referral to Physical Therapy       Return in about 3 months (around 04/25/2020) for sleep.  Lesleigh Noe, MD

## 2020-01-24 NOTE — Assessment & Plan Note (Signed)
Willing to try trazodone. Knows not to take zaleplon daily - he is alternating with melatonin. May try trazodone next time he is due for a refill, f/u in 3 months.

## 2020-02-01 DIAGNOSIS — E6609 Other obesity due to excess calories: Secondary | ICD-10-CM | POA: Diagnosis not present

## 2020-02-01 DIAGNOSIS — G4733 Obstructive sleep apnea (adult) (pediatric): Secondary | ICD-10-CM | POA: Diagnosis not present

## 2020-02-01 DIAGNOSIS — Z723 Lack of physical exercise: Secondary | ICD-10-CM | POA: Diagnosis not present

## 2020-02-01 DIAGNOSIS — Z724 Inappropriate diet and eating habits: Secondary | ICD-10-CM | POA: Diagnosis not present

## 2020-02-01 DIAGNOSIS — Z713 Dietary counseling and surveillance: Secondary | ICD-10-CM | POA: Diagnosis not present

## 2020-02-01 DIAGNOSIS — E785 Hyperlipidemia, unspecified: Secondary | ICD-10-CM | POA: Diagnosis not present

## 2020-02-01 DIAGNOSIS — I10 Essential (primary) hypertension: Secondary | ICD-10-CM | POA: Diagnosis not present

## 2020-02-12 ENCOUNTER — Encounter: Payer: Self-pay | Admitting: Family Medicine

## 2020-02-12 DIAGNOSIS — Z85828 Personal history of other malignant neoplasm of skin: Secondary | ICD-10-CM | POA: Diagnosis not present

## 2020-02-12 DIAGNOSIS — Z86018 Personal history of other benign neoplasm: Secondary | ICD-10-CM | POA: Diagnosis not present

## 2020-02-12 DIAGNOSIS — L578 Other skin changes due to chronic exposure to nonionizing radiation: Secondary | ICD-10-CM | POA: Diagnosis not present

## 2020-02-12 DIAGNOSIS — Z872 Personal history of diseases of the skin and subcutaneous tissue: Secondary | ICD-10-CM | POA: Diagnosis not present

## 2020-02-12 DIAGNOSIS — G4700 Insomnia, unspecified: Secondary | ICD-10-CM

## 2020-02-12 MED ORDER — TRAZODONE HCL 50 MG PO TABS: 25.0000 mg | ORAL_TABLET | Freq: Every evening | ORAL | 0 refills | Status: DC | PRN

## 2020-02-20 ENCOUNTER — Ambulatory Visit
Admission: EM | Admit: 2020-02-20 | Discharge: 2020-02-20 | Disposition: A | Discharge status: Home / Self Care | Payer: Federal, State, Local not specified - PPO | Attending: Family Medicine | Admitting: Family Medicine

## 2020-02-20 ENCOUNTER — Encounter: Payer: Self-pay | Admitting: Emergency Medicine

## 2020-02-20 ENCOUNTER — Other Ambulatory Visit: Payer: Self-pay

## 2020-02-20 ENCOUNTER — Ambulatory Visit (INDEPENDENT_AMBULATORY_CARE_PROVIDER_SITE_OTHER): Payer: Federal, State, Local not specified - PPO

## 2020-02-20 DIAGNOSIS — M1611 Unilateral primary osteoarthritis, right hip: Secondary | ICD-10-CM | POA: Diagnosis not present

## 2020-02-20 DIAGNOSIS — G8929 Other chronic pain: Secondary | ICD-10-CM | POA: Diagnosis not present

## 2020-02-20 DIAGNOSIS — R52 Pain, unspecified: Secondary | ICD-10-CM

## 2020-02-20 DIAGNOSIS — M160 Bilateral primary osteoarthritis of hip: Secondary | ICD-10-CM

## 2020-02-20 DIAGNOSIS — M25561 Pain in right knee: Secondary | ICD-10-CM

## 2020-02-20 NOTE — ED Triage Notes (Signed)
Pt c/o right hip and knee pain. Started a couple months ago but has become more constant. He states he is only here for xrays as his PCP would not order them because she did not feel it was necessary. Pt is scheduled to start PT this week.

## 2020-02-20 NOTE — ED Provider Notes (Signed)
[facility]    CSN: [id number] Arrival date & time: 02/20/20  0931  History   Chief Complaint Chief Complaint  Patient presents with  . Hip Pain    right  . Knee Pain    right   HPI  59 year old male presents with the above complaints.  Patient reports ongoing right hip pain as well as right knee pain.  He states that his hip pain has been ongoing for the past 2 to 3 months.  He states that now his pain is constant and is worse at night.  He describes it as a sharp stabbing pain.  It is located primarily in the right buttock around the piriformis.  No relieving factors.  Additionally, patient reports ongoing right knee pain.  Worse when going downstairs.  This has been ongoing for the past month.  Patient states that he is scheduled for physical therapy.  He states that he would like x-rays.  He states that his primary care provider did not feel that they were warranted.  Pain currently 6/10 in severity.  No other associated symptoms.  No other complaints.   Past Medical History:  Diagnosis Date  . Anxiety   . Depression   . GERD (gastroesophageal reflux disease)   . Hyperlipidemia   . Hypertension     Patient Active Problem List   Diagnosis Date Noted  . Right hip pain 01/24/2020  . Trigger thumb of left hand 03/30/2019  . Traumatic rupture of right UCL, subsequent encounter 03/30/2019  . Chronic heel pain, right 03/30/2019  . Right knee pain 03/30/2019  . Meralgia paresthetica of both lower extremities 12/01/2017  . Insomnia 08/12/2016  . Erectile dysfunction 07/07/2013  . GERD (gastroesophageal reflux disease)   . Hyperlipidemia   . Hypertension     Past Surgical History:  Procedure Laterality Date  . [place]  . CHOLECYSTECTOMY    . CHOLECYSTECTOMY, LAPAROSCOPIC  2000  . keratotomy  1995  . TONSILLECTOMY AND ADENOIDECTOMY  1960's  . VASECTOMY  1987  . VASECTOMY REVERSAL  1994  .  VASECTOMY REVERSAL         Home Medications    Prior to Admission medications   Medication Sig Start Date End Date Taking? Authorizing Provider  baclofen (LIORESAL) 10 MG tablet Take 1 tablet (10 mg total) by mouth 3 (three) times daily. 09/06/19  Yes Lucille Passy, MD  ibuprofen (ADVIL) 800 MG tablet Take 1 tablet (800 mg total) by mouth every 8 (eight) hours as needed. 09/06/19  Yes Lucille Passy, MD  olmesartan (BENICAR) 20 MG tablet Take 1 tablet (20 mg total) by mouth daily. 09/06/19  Yes Lucille Passy, MD  omeprazole (PRILOSEC) 20 MG capsule TAKE 1 CAPSULE (20 MG TOTAL) BY MOUTH DAILY AS NEEDED. 07/10/19  Yes Lucille Passy, MD  rosuvastatin (CRESTOR) 40 MG tablet TAKE 1 TABLET BY MOUTH EVERYDAY AT BEDTIME 01/01/20  Yes Lesleigh Noe, MD  zaleplon (SONATA) 10 MG capsule TAKE 1 CAPSULE BY MOUTH AT BEDTIME AS NEEDED SLEEP 01/10/20  Yes Lesleigh Noe, MD  metoprolol tartrate (LOPRESSOR) 50 MG tablet Take 1 tablet (50 mg total) by mouth 2 (two) times daily. 09/06/19   Lucille Passy, MD  phentermine (ADIPEX-P) 37.5 MG tablet Take 37.5 mg by mouth daily. 12/30/19   [provider]  traZODone (DESYREL) 50 MG tablet Take 0.5-1 tablets (25-50 mg total) by mouth at bedtime as needed  for sleep. 02/12/20   Pleas Koch, NP    Family History Family History  Problem Relation Age of Onset  . Breast cancer Mother   . Hypertension Father   . Hyperlipidemia Father   . Heart disease Father 109       CABG x 4  . Lymphoma Father        lymphoma  . Lung cancer Maternal Grandfather     Social History Social History   Tobacco Use  . Smoking status: Never Smoker  . Smokeless tobacco: Never Used  Substance Use Topics  . Alcohol use: Yes    Comment: 4 [place] a week  . Drug use: No     Allergies   Lisinopril   Review of Systems Review of Systems  Constitutional: Negative.   Musculoskeletal:       Right knee pain; right hip pain.   Physical Exam Triage Vital Signs ED  Triage Vitals  Enc Vitals Group     BP 02/20/20 0945 119/85     Pulse Rate 02/20/20 0945 73     Resp 02/20/20 0945 18     Temp 02/20/20 0945 98.4 F (36.9 C)     Temp Source 02/20/20 0945 Oral     SpO2 02/20/20 0945 100 %     Weight 02/20/20 0942 228 lb 6.3 oz (103.6 kg)     Height 02/20/20 0942 6' 0.5" (1.842 m)     Head Circumference --      Peak Flow --      Pain Score 02/20/20 0942 6     Pain Loc --      Pain Edu? --      Excl. in [place]? --    Updated Vital Signs BP 119/85 (BP Location: Right Arm)   Pulse 73   Temp 98.4 F (36.9 C) (Oral)   Resp 18   Ht 6' 0.5" (1.842 m)   Wt 103.6 kg   SpO2 100%   BMI 30.55 kg/m   Visual Acuity Right Eye Distance:   Left Eye Distance:   Bilateral Distance:    Right Eye Near:   Left Eye Near:    Bilateral Near:     Physical Exam Vitals and nursing note reviewed.  Constitutional:      General: He is not in acute distress.    Appearance: Normal appearance. He is not ill-appearing.  HENT:     Head: Normocephalic and atraumatic.  Eyes:     General:        Right eye: No discharge.        Left eye: No discharge.     Conjunctiva/sclera: Conjunctivae normal.  Pulmonary:     Effort: Pulmonary effort is normal. No respiratory distress.  Musculoskeletal:     Comments: Right hip -patient with tenderness over the greater trochanter as well as the piriformis.  Good range of motion of the hip.  Negative FADIR and FABER.  Right knee -no discrete areas of tenderness to palpation.  Ligaments intact.  No apparent effusion.  Neurological:     Mental Status: He is alert.  Psychiatric:        Mood and Affect: Mood normal.        Behavior: Behavior normal.    UC Treatments / Results  Labs (all labs ordered are listed, but only abnormal results are displayed) Labs Reviewed - No data to display  EKG   Radiology DG Knee Complete 4 Views Right  Result Date: 02/20/2020 CLINICAL DATA:  Right knee pain EXAM: RIGHT KNEE - COMPLETE 4+ VIEW  COMPARISON:  03/30/2019 FINDINGS: No evidence of fracture, dislocation, or joint effusion. No evidence of arthropathy or other focal bone abnormality. Soft tissues are unremarkable. IMPRESSION: Negative. Electronically Signed   By: Davina Poke D.O.   On: 02/20/2020 10:24   DG Hip Unilat With Pelvis 2-3 Views Right  Result Date: 02/20/2020 CLINICAL DATA:  Right hip pain EXAM: DG HIP (WITH OR WITHOUT PELVIS) 2-3V RIGHT COMPARISON:  None. FINDINGS: There is no evidence of hip fracture or dislocation. Mild joint space narrowing of the bilateral hips. IMPRESSION: Mild osteoarthritis of the bilateral hips. Electronically Signed   By: Davina Poke D.O.   On: 02/20/2020 10:22    Procedures Procedures (including critical care time)  Medications Ordered in UC Medications - No data to display  Initial Impression / Assessment and Plan / UC Course  I have reviewed the triage vital signs and the nursing notes.  Pertinent labs & imaging results that were available during my care of the patient were reviewed by me and considered in my medical decision making (see chart for details).    59 year old male presents with right knee pain and right hip pain.  X-ray of the right knee negative.  X-ray of the hip revealed mild osteoarthritis.  Advised to go to PT as prescribed.  Continue his regular prescribed medications.  Follow-up with PCP.  Final Clinical Impressions(s) / UC Diagnoses   Final diagnoses:  Pain  Primary osteoarthritis of both hips  Chronic pain of right knee     Discharge Instructions     PT as prescribed.  Follow up with PCP.  Take care  Dr. Lacinda Axon     ED Prescriptions    None     PDMP not reviewed this encounter.   Coral Spikes, [place] 02/20/20 1033

## 2020-02-20 NOTE — Discharge Instructions (Signed)
PT as prescribed.  Follow up with PCP.  Take care  Dr. Lacinda Axon

## 2020-02-22 ENCOUNTER — Other Ambulatory Visit: Payer: Self-pay | Admitting: Family Medicine

## 2020-02-22 DIAGNOSIS — M25551 Pain in right hip: Secondary | ICD-10-CM | POA: Diagnosis not present

## 2020-02-22 MED ORDER — ZALEPLON 10 MG PO CAPS: ORAL_CAPSULE | ORAL | 0 refills | Status: DC

## 2020-02-22 NOTE — Telephone Encounter (Signed)
JC-Plz see refill req/thx dmf 

## 2020-03-04 ENCOUNTER — Other Ambulatory Visit: Payer: Self-pay

## 2020-03-04 MED ORDER — OLMESARTAN MEDOXOMIL 20 MG PO TABS: 20.0000 mg | ORAL_TABLET | Freq: Every day | ORAL | 1 refills | Status: DC

## 2020-03-07 DIAGNOSIS — M25551 Pain in right hip: Secondary | ICD-10-CM | POA: Diagnosis not present

## 2020-03-21 ENCOUNTER — Other Ambulatory Visit: Payer: Self-pay | Admitting: Family Medicine

## 2020-03-21 NOTE — Telephone Encounter (Signed)
Please clarify if patient is taking this every day. During our last visit he mentioned he was alternating. If that is the case this refill is a little early.   Will refill today, but no additional refills until his follow-up appointment

## 2020-03-29 ENCOUNTER — Other Ambulatory Visit: Payer: Self-pay | Admitting: Family Medicine

## 2020-04-18 ENCOUNTER — Ambulatory Visit: Payer: Federal, State, Local not specified - PPO | Admitting: Family Medicine

## 2020-04-19 ENCOUNTER — Telehealth: Payer: Self-pay | Admitting: Family Medicine

## 2020-04-19 MED ORDER — ZALEPLON 10 MG PO CAPS: ORAL_CAPSULE | ORAL | 0 refills | Status: DC

## 2020-04-19 NOTE — Telephone Encounter (Signed)
Patient called.  Patient had an appointment with Dr.Cody yesterday for f/u controlled substance. The appointment was cancelled due to Dr [name] being out of the office.  Patient's out of medication.  Patient uses [facility] [address]. Patient already had appointment scheduled with Dr.Cody on 05/02/20 for a 3 month follow up for sleep.  Patient will keep that appointment.

## 2020-04-19 NOTE — Telephone Encounter (Signed)
  Name of Medication: Sonata Name of Pharmacy: [facility] or Written Date and Quantity: 7/1, #30 Last Office Visit and Type: 01/24/20, F/U BP Next Office Visit and Type: 8/12 F/U Cont Sub Last Controlled Substance Agreement Date: 07/29/15 Last UDS: 01/26/16

## 2020-04-19 NOTE — Telephone Encounter (Signed)
Medication sent to Joseph Church per patient request. Will update contract at our next visit.

## 2020-04-19 NOTE — Telephone Encounter (Signed)
Left VM for pt letting him know that refill has been sent in.

## 2020-04-29 ENCOUNTER — Telehealth: Payer: Self-pay | Admitting: *Deleted

## 2020-04-29 NOTE — Telephone Encounter (Signed)
Received fax from [facility] requesting refill on Metoprolol 50 mg.  Per Dr. Verda Cumins last visit with patient he was to decrease to Metoprolol 25 mg twice a day.  Metoprolol is not listed at all on his current medication list.  Please advise.

## 2020-04-29 NOTE — Telephone Encounter (Signed)
MyChart to patient to get clarification

## 2020-05-02 ENCOUNTER — Telehealth: Payer: Self-pay | Admitting: Family Medicine

## 2020-05-02 ENCOUNTER — Ambulatory Visit: Payer: Federal, State, Local not specified - PPO | Admitting: Family Medicine

## 2020-05-02 NOTE — Telephone Encounter (Signed)
Pt called wanting to see if he could get an appointment today or tomorrow we have no appointment.  He has posion ivey all over face neck arms legs  He would like see if you would call him in prednisone for this  [place]  Cell phone [phone] Can leave message on cel

## 2020-05-02 NOTE — Telephone Encounter (Signed)
Dr. Silvio Pate will add on patient at end of clinic day

## 2020-05-02 NOTE — Telephone Encounter (Signed)
Left message on pt's VM (DPR), requesting pt return my call in reference to metoprolol refill request.

## 2020-05-02 NOTE — Telephone Encounter (Signed)
Patient contacted the office. He states this must have been an automatic refill from the pharmacy, and he does not need this medication. He states he is not currently taking this medication, and his BP has been running around 120/70 daily!  FYI to Dr. [name]

## 2020-05-03 NOTE — Telephone Encounter (Signed)
Spoke with pt.  He cannot come in today @ 12:30 he has clinic today.  He wanted me to ask if they would call in prednisone.  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

## 2020-05-03 NOTE — Telephone Encounter (Signed)
Left message on verified VM that a rx willnot be called in. Dr Silvio Pate has offered to do a VV at 1245 if that would work for him. Otherwise, if he is wanting a rx, he will need to go to an UC.

## 2020-05-03 NOTE — Telephone Encounter (Signed)
His own doctor was not comfortable just sending the prescription without a visit---so I am not either. I have offered a visit---he can go to urgent care I could also do virtual

## 2020-05-05 ENCOUNTER — Ambulatory Visit
Admission: EM | Admit: 2020-05-05 | Discharge: 2020-05-05 | Disposition: A | Discharge status: Home / Self Care | Payer: Federal, State, Local not specified - PPO | Attending: Internal Medicine | Admitting: Internal Medicine

## 2020-05-05 ENCOUNTER — Other Ambulatory Visit: Payer: Self-pay

## 2020-05-05 DIAGNOSIS — L255 Unspecified contact dermatitis due to plants, except food: Secondary | ICD-10-CM | POA: Diagnosis not present

## 2020-05-05 MED ORDER — PREDNISONE 10 MG (21) PO TBPK
ORAL_TABLET | Freq: Every day | ORAL | 0 refills | Status: DC
Start: 2020-05-05 — End: 2020-12-11

## 2020-05-05 MED ORDER — PREDNISONE 10 MG (21) PO TBPK
ORAL_TABLET | Freq: Every day | ORAL | 0 refills | Status: DC
Start: 2020-05-05 — End: 2020-05-05

## 2020-05-05 NOTE — ED Triage Notes (Signed)
Patient in today for a rash. Patient states he got into poison ivy approx. 1 wk ago.

## 2020-05-05 NOTE — ED Provider Notes (Signed)
[facility]    CSN: [id number] Arrival date & time: 05/05/20  0810      History   Chief Complaint Chief Complaint  Patient presents with  . Rash    ?Poison Ivy    HPI Joseph Church is a 59 y.o. male comes to the urgent care with a 1 week history of pruritic rash over extremities and his neck as well as the face.  Started after he got into some poison ivy approximately 1 week ago.  Patient has tried over-the-counter remedies with no improvement.  No fever or chills.Marland Kitchen   HPI  Past Medical History:  Diagnosis Date  . Anxiety   . Depression   . GERD (gastroesophageal reflux disease)   . Hyperlipidemia   . Hypertension     Patient Active Problem List   Diagnosis Date Noted  . Right hip pain 01/24/2020  . Trigger thumb of left hand 03/30/2019  . Traumatic rupture of right UCL, subsequent encounter 03/30/2019  . Chronic heel pain, right 03/30/2019  . Right knee pain 03/30/2019  . Meralgia paresthetica of both lower extremities 12/01/2017  . Insomnia 08/12/2016  . Erectile dysfunction 07/07/2013  . GERD (gastroesophageal reflux disease)   . Hyperlipidemia   . Hypertension     Past Surgical History:  Procedure Laterality Date  . [place]  . CHOLECYSTECTOMY    . CHOLECYSTECTOMY, LAPAROSCOPIC  2000  . keratotomy  1995  . TONSILLECTOMY AND ADENOIDECTOMY  1960's  . VASECTOMY  1987  . VASECTOMY REVERSAL  1994  . VASECTOMY REVERSAL         Home Medications    Prior to Admission medications   Medication Sig Start Date End Date Taking? Authorizing Provider  baclofen (LIORESAL) 10 MG tablet Take 1 tablet (10 mg total) by mouth 3 (three) times daily. 09/06/19  Yes Lucille Passy, MD  ibuprofen (ADVIL) 800 MG tablet Take 1 tablet (800 mg total) by mouth every 8 (eight) hours as needed. 09/06/19  Yes Lucille Passy, MD  olmesartan (BENICAR) 20 MG tablet Take 1 tablet (20 mg total) by mouth  daily. 03/04/20  Yes Lesleigh Noe, MD  omeprazole (PRILOSEC) 20 MG capsule TAKE 1 CAPSULE (20 MG TOTAL) BY MOUTH DAILY AS NEEDED. 07/10/19  Yes Lucille Passy, MD  rosuvastatin (CRESTOR) 40 MG tablet TAKE 1 TABLET BY MOUTH EVERY DAY AT BEDTIME 04/01/20  Yes Lesleigh Noe, MD  zaleplon (SONATA) 10 MG capsule TAKE 1 CAPSULE BY MOUTH AT BEDTIME AS NEEDED FOR SLEEP 04/19/20  Yes Lesleigh Noe, MD  predniSONE (STERAPRED UNI-PAK 21 TAB) 10 MG (21) TBPK tablet Take by mouth daily. Take 6 tabs by mouth daily  for 2 days, then 5 tabs for 2 days, then 4 tabs for 2 days, then 3 tabs for 2 days, 2 tabs for 2 days, then 1 tab by mouth daily for 2 days 05/05/20   Chase Picket, MD    Family History Family History  Problem Relation Age of Onset  . Breast cancer Mother   . Hypertension Father   . Hyperlipidemia Father   . Heart disease Father 42       CABG x 4  . Lymphoma Father        lymphoma  . Lung cancer Maternal Grandfather     Social History Social History   Tobacco Use  . Smoking status: Never Smoker  . Smokeless tobacco: Never Used  Vaping Use  . Vaping Use: Never used  Substance Use Topics  . Alcohol use: Yes    Comment: 4 [place] a week  . Drug use: No     Allergies   Patient has no active allergies.   Review of Systems Review of Systems  HENT: Negative.   Gastrointestinal: Negative.   Skin: Positive for color change and rash.     Physical Exam Triage Vital Signs ED Triage Vitals  Enc Vitals Group     BP 05/05/20 0820 129/88     Pulse Rate 05/05/20 0820 64     Resp 05/05/20 0820 18     Temp 05/05/20 0820 98 F (36.7 C)     Temp Source 05/05/20 0820 Oral     SpO2 05/05/20 0820 100 %     Weight 05/05/20 0821 230 lb (104.3 kg)     Height 05/05/20 0821 6\' 1"  (1.854 m)     Head Circumference --      Peak Flow --      Pain Score 05/05/20 0821 0     Pain Loc --      Pain Edu? --      Excl. in [place]? --    No data found.  Updated Vital Signs BP 129/88 (BP  Location: Right Arm)   Pulse 64   Temp 98 F (36.7 C) (Oral)   Resp 18   Ht 6\' 1"  (1.854 m)   Wt 104.3 kg   SpO2 100%   BMI 30.34 kg/m   Visual Acuity Right Eye Distance:   Left Eye Distance:   Bilateral Distance:    Right Eye Near:   Left Eye Near:    Bilateral Near:     Physical Exam Vitals and nursing note reviewed.  Cardiovascular:     Rate and Rhythm: Normal rate and regular rhythm.     Pulses: Normal pulses.     Heart sounds: Normal heart sounds.  Skin:    Comments: Erythematous rash over the upper extremities, legs as well as the left side of the neck and the right side of the face.  No vesicles.  Rash is papular.      UC Treatments / Results  Labs (all labs ordered are listed, but only abnormal results are displayed) Labs Reviewed - No data to display  EKG   Radiology No results found.  Procedures Procedures (including critical care time)  Medications Ordered in UC Medications - No data to display  Initial Impression / Assessment and Plan / UC Course  I have reviewed the triage vital signs and the nursing notes.  Pertinent labs & imaging results that were available during my care of the patient were reviewed by me and considered in my medical decision making (see chart for details).    1.  Rhus dermatitis: Continue using antiitch cream or hydroxyzine I have offered patient hydroxyzine he said he has hydroxyzine at home. Tapering dose of prednisone given the extensive nature of the rash If patient experiences worsening rash, worsening erythema-he is advised to return to the urgent care to be reevaluated. Final Clinical Impressions(s) / UC Diagnoses   Final diagnoses:  Rhus dermatitis   Discharge Instructions   None    ED Prescriptions    Medication Sig Dispense Auth. Provider   predniSONE (STERAPRED UNI-PAK 21 TAB) 10 MG (21) TBPK tablet  (Status: Discontinued) Take by mouth daily. Take 6 tabs by mouth daily  for 2 days, then 5 tabs for 2  days,  then 4 tabs for 2 days, then 3 tabs for 2 days, 2 tabs for 2 days, then 1 tab by mouth daily for 2 days 42 tablet Tinzley Dalia, Myrene Galas, MD   predniSONE (STERAPRED UNI-PAK 21 TAB) 10 MG (21) TBPK tablet Take by mouth daily. Take 6 tabs by mouth daily  for 2 days, then 5 tabs for 2 days, then 4 tabs for 2 days, then 3 tabs for 2 days, 2 tabs for 2 days, then 1 tab by mouth daily for 2 days 42 tablet Vega Withrow, Myrene Galas, MD     PDMP not reviewed this encounter.   Chase Picket, MD 05/05/20 [phone]

## 2020-05-09 ENCOUNTER — Ambulatory Visit (INDEPENDENT_AMBULATORY_CARE_PROVIDER_SITE_OTHER): Payer: Federal, State, Local not specified - PPO | Admitting: Family Medicine

## 2020-05-09 ENCOUNTER — Encounter: Payer: Self-pay | Admitting: Family Medicine

## 2020-05-09 ENCOUNTER — Ambulatory Visit: Payer: Self-pay

## 2020-05-09 ENCOUNTER — Other Ambulatory Visit: Payer: Self-pay

## 2020-05-09 VITALS — BP 110/80 | HR 76 | Ht 73.0 in | Wt 234.0 lb

## 2020-05-09 DIAGNOSIS — S5331XD Traumatic rupture of right ulnar collateral ligament, subsequent encounter: Secondary | ICD-10-CM

## 2020-05-09 DIAGNOSIS — M79644 Pain in right finger(s): Secondary | ICD-10-CM

## 2020-05-09 DIAGNOSIS — M79645 Pain in left finger(s): Secondary | ICD-10-CM | POA: Diagnosis not present

## 2020-05-09 DIAGNOSIS — M65312 Trigger thumb, left thumb: Secondary | ICD-10-CM

## 2020-05-09 NOTE — Assessment & Plan Note (Signed)
[place] injected today May 09, 2020.  Discussed the potential for bracing, home exercise, topical anti-inflammatories.  Patient feels that this does make improvement.  Follow-up again in 6 months

## 2020-05-09 NOTE — Assessment & Plan Note (Signed)
Patient responded well to injections previously.  Discussed which activities to do which wants to avoid.  Patient knows that this can be a chronic problem but encouraged bracing at night.  Follow-up again in 4 to 8 weeks

## 2020-05-09 NOTE — Progress Notes (Signed)
[facility] [address] [place] Phone: [phone] Subjective:   I Joseph Church am serving as a Education administrator for Dr. Hulan Saas.  This visit occurred during the SARS-CoV-2 public health emergency.  Safety protocols were in place, including screening questions prior to the visit, additional usage of staff PPE, and extensive cleaning of exam room while observing appropriate contact time as indicated for disinfecting solutions.   I'm seeing this patient by the request  of:  Lesleigh Noe, MD  CC:   [id number]   09/07/2019 Seen on MRI and, underwent conservative, injection given today.  If continuing to have difficulty will need to consider the possibility  Repeat injection given again today.  We discussed if serial injections are necessary either surgical intervention or PRP may be necessary.  Patient is in agreement with the plan.  Update 05/09/2020 Joseph Church is a 59 y.o. male coming in with complaint of bilateral thumb pain. Patient states he is still in pain. Trigger thumb kills him. Would like bilateral injections.  Patient states that it is starting to affect his job performance a little bit.      Past Medical History:  Diagnosis Date  . Anxiety   . Depression   . GERD (gastroesophageal reflux disease)   . Hyperlipidemia   . Hypertension    Past Surgical History:  Procedure Laterality Date  . [place]  . CHOLECYSTECTOMY    . CHOLECYSTECTOMY, LAPAROSCOPIC  2000  . keratotomy  1995  . TONSILLECTOMY AND ADENOIDECTOMY  1960's  . VASECTOMY  1987  . VASECTOMY REVERSAL  1994  . VASECTOMY REVERSAL     Social History   Socioeconomic History  . Marital status: Married    Spouse name: [name]  . Number of children: 5  . Years of education: bachelors degree  . Highest education level: Not on file  Occupational History  . Occupation: PA   Tobacco Use  . Smoking status: Never Smoker  . Smokeless tobacco: Never Used  Vaping Use  . Vaping Use: Never used  Substance and Sexual Activity  . Alcohol use: Yes    Comment: 4 [place] a week  . Drug use: No  . Sexual activity: Yes    Birth control/protection: Surgical  Other Topics Concern  . Not on file  Social History Narrative   01/24/20   From: [place] originally, moved here for [employer]   Living: with wife, [name] (2016)   Work: Librarian, academic, for the [employer] in [place]       Family: 5 children - all in [place] and good relationship - 4 grandchildren and then foster grandchildren      Enjoys: golf      Exercise: yardwork   Diet: low carb, high protein - for weight loss      Safety   Seat belts: Yes    Guns: Yes  and secure   Safe in relationships: Yes    Social Determinants of Health   Financial Resource Strain:   . Difficulty of Paying Living Expenses: Not on file  Food Insecurity:   . Worried About Charity fundraiser in the Last Year: Not on file  . Ran Out of Food in the Last Year: Not on file  Transportation Needs:   . Lack of Transportation (Medical): Not on file  . Lack of Transportation (Non-Medical): Not on file  Physical Activity:   .  Days of Exercise per Week: Not on file  . Minutes of Exercise per Session: Not on file  Stress:   . Feeling of Stress : Not on file  Social Connections:   . Frequency of Communication with Friends and Family: Not on file  . Frequency of Social Gatherings with Friends and Family: Not on file  . Attends Religious Services: Not on file  . Active Member of Clubs or Organizations: Not on file  . Attends Archivist Meetings: Not on file  . Marital Status: Not on file   No Active Allergies Family History  Problem Relation Age of Onset  . Breast cancer Mother   . Hypertension Father   . Hyperlipidemia Father   . Heart disease Father 71       CABG x 4  . Lymphoma Father        lymphoma  .  Lung cancer Maternal Grandfather     Current Outpatient Medications (Endocrine & Metabolic):  .  predniSONE (STERAPRED UNI-PAK 21 TAB) 10 MG (21) TBPK tablet, Take by mouth daily. Take 6 tabs by mouth daily  for 2 days, then 5 tabs for 2 days, then 4 tabs for 2 days, then 3 tabs for 2 days, 2 tabs for 2 days, then 1 tab by mouth daily for 2 days  Current Outpatient Medications (Cardiovascular):  .  olmesartan (BENICAR) 20 MG tablet, Take 1 tablet (20 mg total) by mouth daily. .  rosuvastatin (CRESTOR) 40 MG tablet, TAKE 1 TABLET BY MOUTH EVERY DAY AT BEDTIME   Current Outpatient Medications (Analgesics):  .  ibuprofen (ADVIL) 800 MG tablet, Take 1 tablet (800 mg total) by mouth every 8 (eight) hours as needed.   Current Outpatient Medications (Other):  .  baclofen (LIORESAL) 10 MG tablet, Take 1 tablet (10 mg total) by mouth 3 (three) times daily. Marland Kitchen  omeprazole (PRILOSEC) 20 MG capsule, TAKE 1 CAPSULE (20 MG TOTAL) BY MOUTH DAILY AS NEEDED. .  zaleplon (SONATA) 10 MG capsule, TAKE 1 CAPSULE BY MOUTH AT BEDTIME AS NEEDED FOR SLEEP   Reviewed prior external information including notes and imaging from  primary care provider As well as notes that were available from care everywhere and other healthcare systems.  Past medical history, social, surgical and family history all reviewed in electronic medical record.  No pertanent information unless stated regarding to the chief complaint.   Review of Systems:  No headache, visual changes, nausea, vomiting, diarrhea, constipation, dizziness, abdominal pain, skin rash, fevers, chills, night sweats, weight loss, swollen lymph nodes, body aches, joint swelling, chest pain, shortness of breath, mood changes. POSITIVE muscle aches  Objective  Blood pressure 110/80, pulse 76, height 6\' 1"  (1.854 m), weight 234 lb (106.1 kg), SpO2 97 %.   General: No apparent distress alert and oriented x3 mood and affect normal, dressed appropriately.  HEENT:  Pupils equal, extraocular movements intact  Respiratory: Patient's speak in full sentences and does not appear short of breath  Cardiovascular: No lower extremity edema, non tender, no erythema  Neuro: Cranial nerves II through XII are intact, neurovascularly intact in all extremities with 2+ DTRs and 2+ pulses.  Gait normal with good balance and coordination.  MSK:   Left thumb shows the patient does have a trigger nodule noted at the A2 pulley.  Tender to palpation in the area Right thumb does have a UCL partial rupture noted.  Does have some gapping.  Tenderness over the [facility] joint.  Procedure: Real-time Ultrasound Guided  Injection of right CMC joint Device: GE Logiq Q7 Ultrasound guided injection is preferred based studies that show increased duration, increased effect, greater accuracy, decreased procedural pain, increased response rate, and decreased cost with ultrasound guided versus blind injection.  Verbal informed consent obtained.  Time-out conducted.  Noted no overlying erythema, induration, or other signs of local infection.  Skin prepped in a sterile fashion.  Local anesthesia: Topical Ethyl chloride.  With sterile technique and under real time ultrasound guidance: With a 25-gauge half inch needle injected with 0.5 cc of 0.5% Marcaine and 0.5 cc of Kenalog 40 mg/mL Completed without difficulty  Pain immediately resolved suggesting accurate placement of the medication.  Advised to call if fevers/chills, erythema, induration, drainage, or persistent bleeding.   Impression: Technically successful ultrasound guided injection.  Procedure: Real-time Ultrasound Guided Injection of left flexor tendon sheath of the thumb Device: GE Logiq Q7 Ultrasound guided injection is preferred based studies that show increased duration, increased effect, greater accuracy, decreased procedural pain, increased response rate, and decreased cost with ultrasound guided versus blind injection.  Verbal  informed consent obtained.  Time-out conducted.  Noted no overlying erythema, induration, or other signs of local infection.  Skin prepped in a sterile fashion.  Local anesthesia: Topical Ethyl chloride.  With sterile technique and under real time ultrasound guidance: With a 25-gauge half inch needle injected with 0.5 cc of 0.5% Marcaine and 0.5 cc of Kenalog 40 mg/mL Completed without difficulty  Pain immediately resolved suggesting accurate placement of the medication.  Advised to call if fevers/chills, erythema, induration, drainage, or persistent bleeding.  Images permanently stored and available for review in the ultrasound unit.  Impression: Technically successful ultrasound guided injection.    Impression and Recommendations:     The above documentation has been reviewed and is accurate and complete Lyndal Pulley, DO       Note: This dictation was prepared with Dragon dictation along with smaller phrase technology. Any transcriptional errors that result from this process are unintentional.

## 2020-05-14 ENCOUNTER — Ambulatory Visit: Payer: Federal, State, Local not specified - PPO | Admitting: Family Medicine

## 2020-05-14 DIAGNOSIS — Z114 Encounter for screening for human immunodeficiency virus [HIV]: Secondary | ICD-10-CM | POA: Diagnosis not present

## 2020-05-14 DIAGNOSIS — Z1389 Encounter for screening for other disorder: Secondary | ICD-10-CM | POA: Diagnosis not present

## 2020-05-14 DIAGNOSIS — Z125 Encounter for screening for malignant neoplasm of prostate: Secondary | ICD-10-CM | POA: Diagnosis not present

## 2020-05-14 DIAGNOSIS — E785 Hyperlipidemia, unspecified: Secondary | ICD-10-CM | POA: Diagnosis not present

## 2020-05-14 DIAGNOSIS — Z0000 Encounter for general adult medical examination without abnormal findings: Secondary | ICD-10-CM | POA: Diagnosis not present

## 2020-05-14 DIAGNOSIS — F5101 Primary insomnia: Secondary | ICD-10-CM | POA: Diagnosis not present

## 2020-05-14 DIAGNOSIS — K219 Gastro-esophageal reflux disease without esophagitis: Secondary | ICD-10-CM | POA: Diagnosis not present

## 2020-05-20 DIAGNOSIS — G4733 Obstructive sleep apnea (adult) (pediatric): Secondary | ICD-10-CM | POA: Diagnosis not present

## 2020-07-24 DIAGNOSIS — M25561 Pain in right knee: Secondary | ICD-10-CM | POA: Diagnosis not present

## 2020-07-24 DIAGNOSIS — M542 Cervicalgia: Secondary | ICD-10-CM | POA: Diagnosis not present

## 2020-07-24 DIAGNOSIS — G8929 Other chronic pain: Secondary | ICD-10-CM | POA: Diagnosis not present

## 2020-07-25 ENCOUNTER — Other Ambulatory Visit: Payer: Self-pay | Admitting: Family Medicine

## 2020-07-25 ENCOUNTER — Other Ambulatory Visit (HOSPITAL_COMMUNITY): Payer: Self-pay | Admitting: Family Medicine

## 2020-07-25 DIAGNOSIS — M542 Cervicalgia: Secondary | ICD-10-CM

## 2020-07-27 IMAGING — CR DG HIP (WITH OR WITHOUT PELVIS) 2-3V*R*
3 series · 3 of 3 positions shown · non-contrast
Comparison: None.

CLINICAL DATA: Right hip pain

EXAM:
DG HIP (WITH OR WITHOUT PELVIS) 2-3V RIGHT

[pelvis ap]
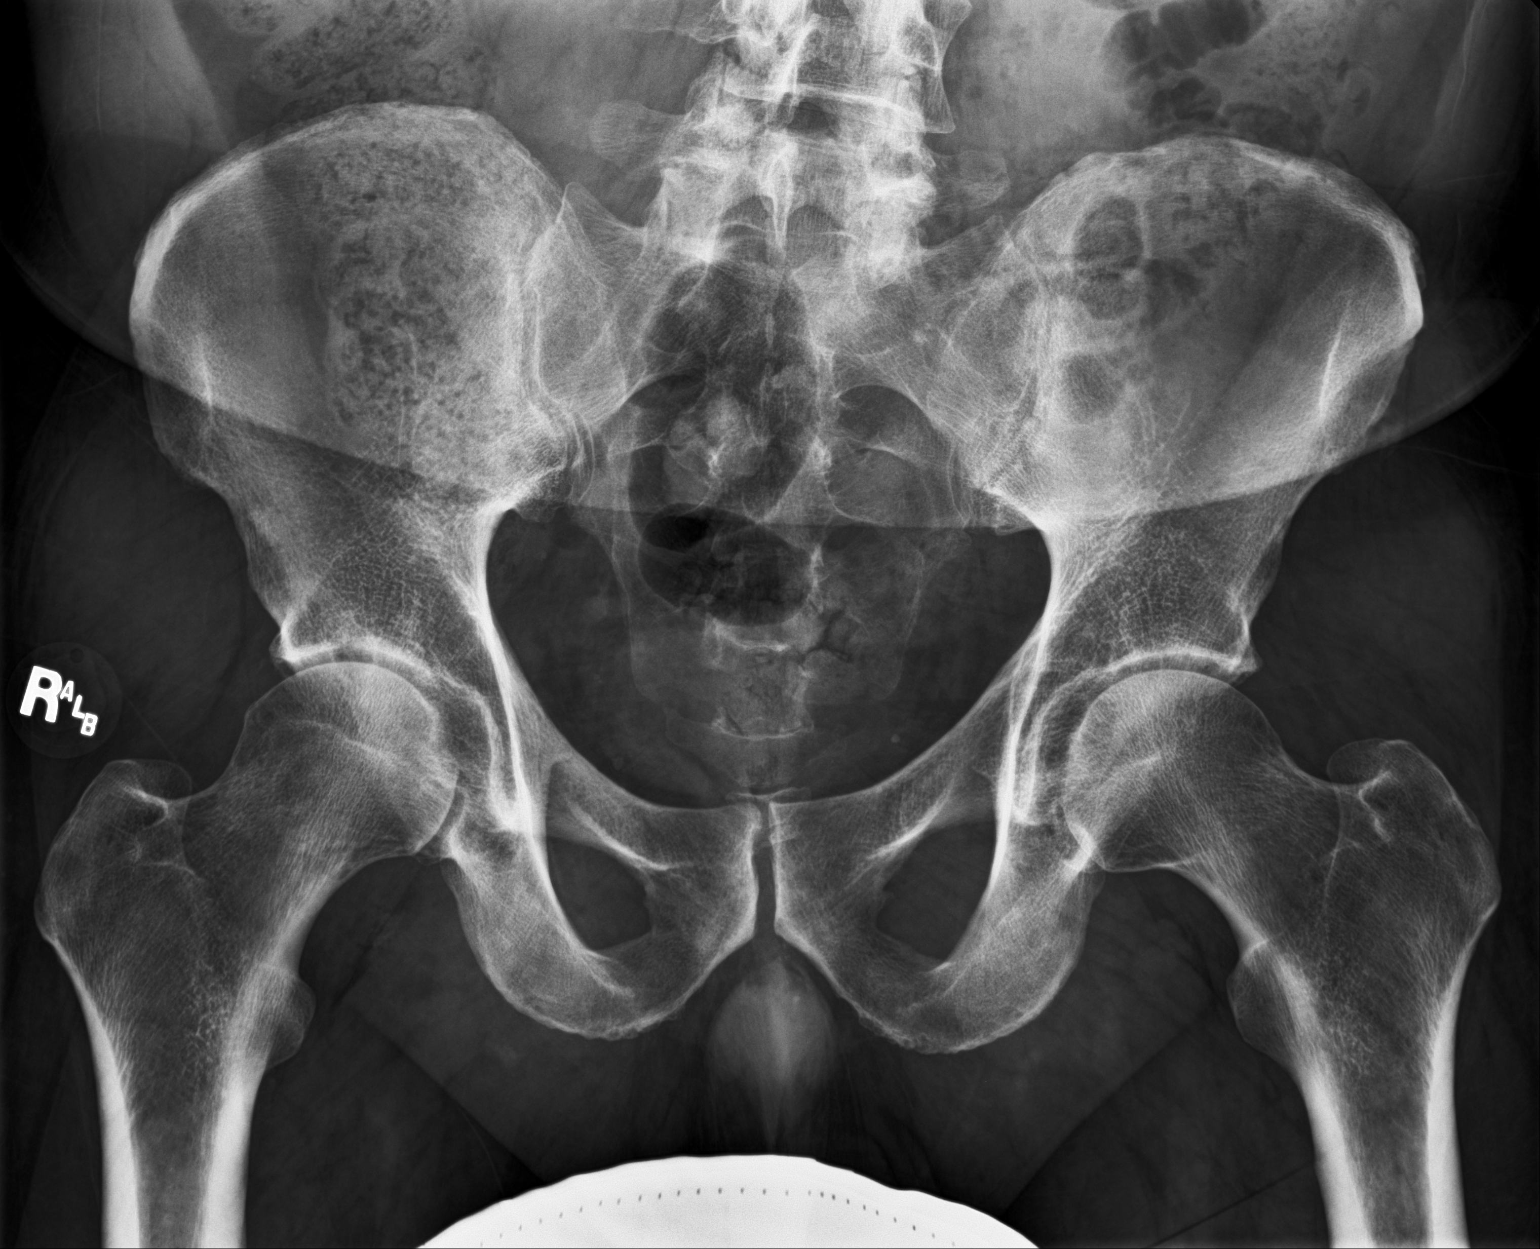

[hip ap]
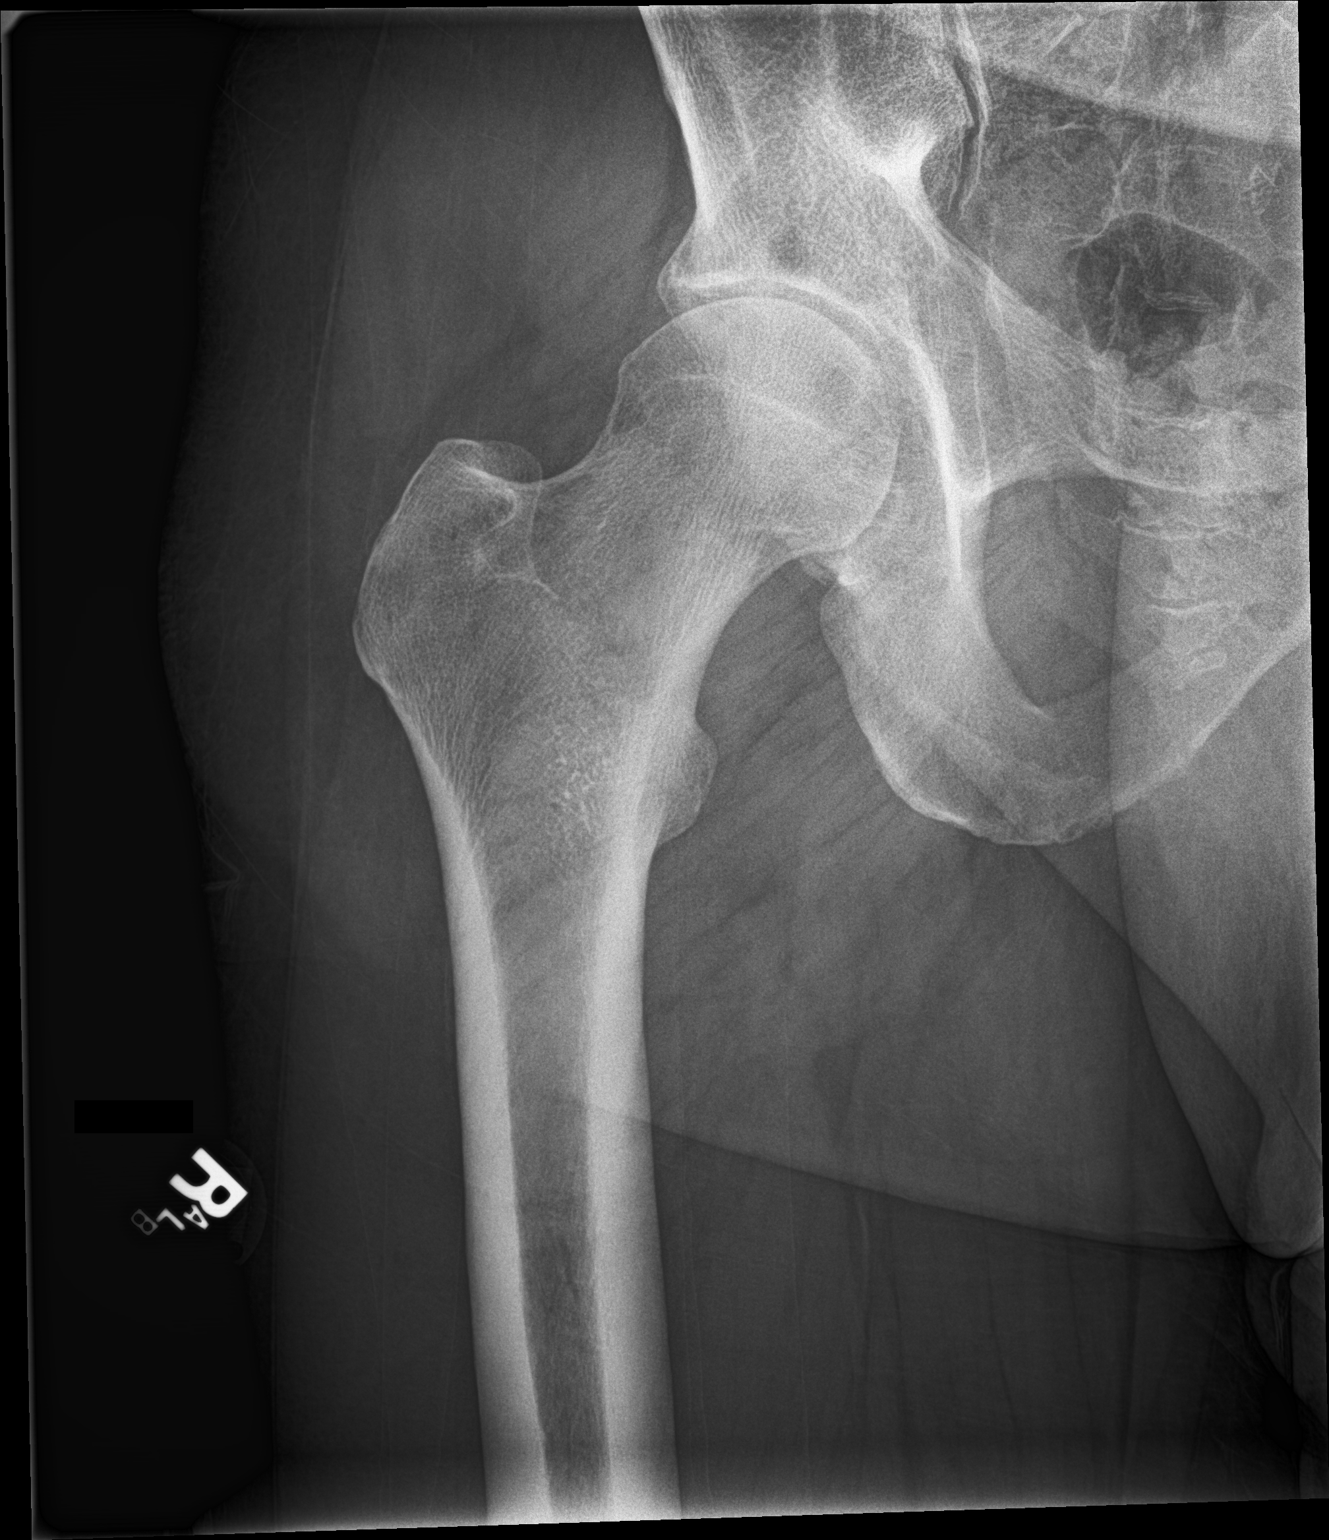

[hip lat]
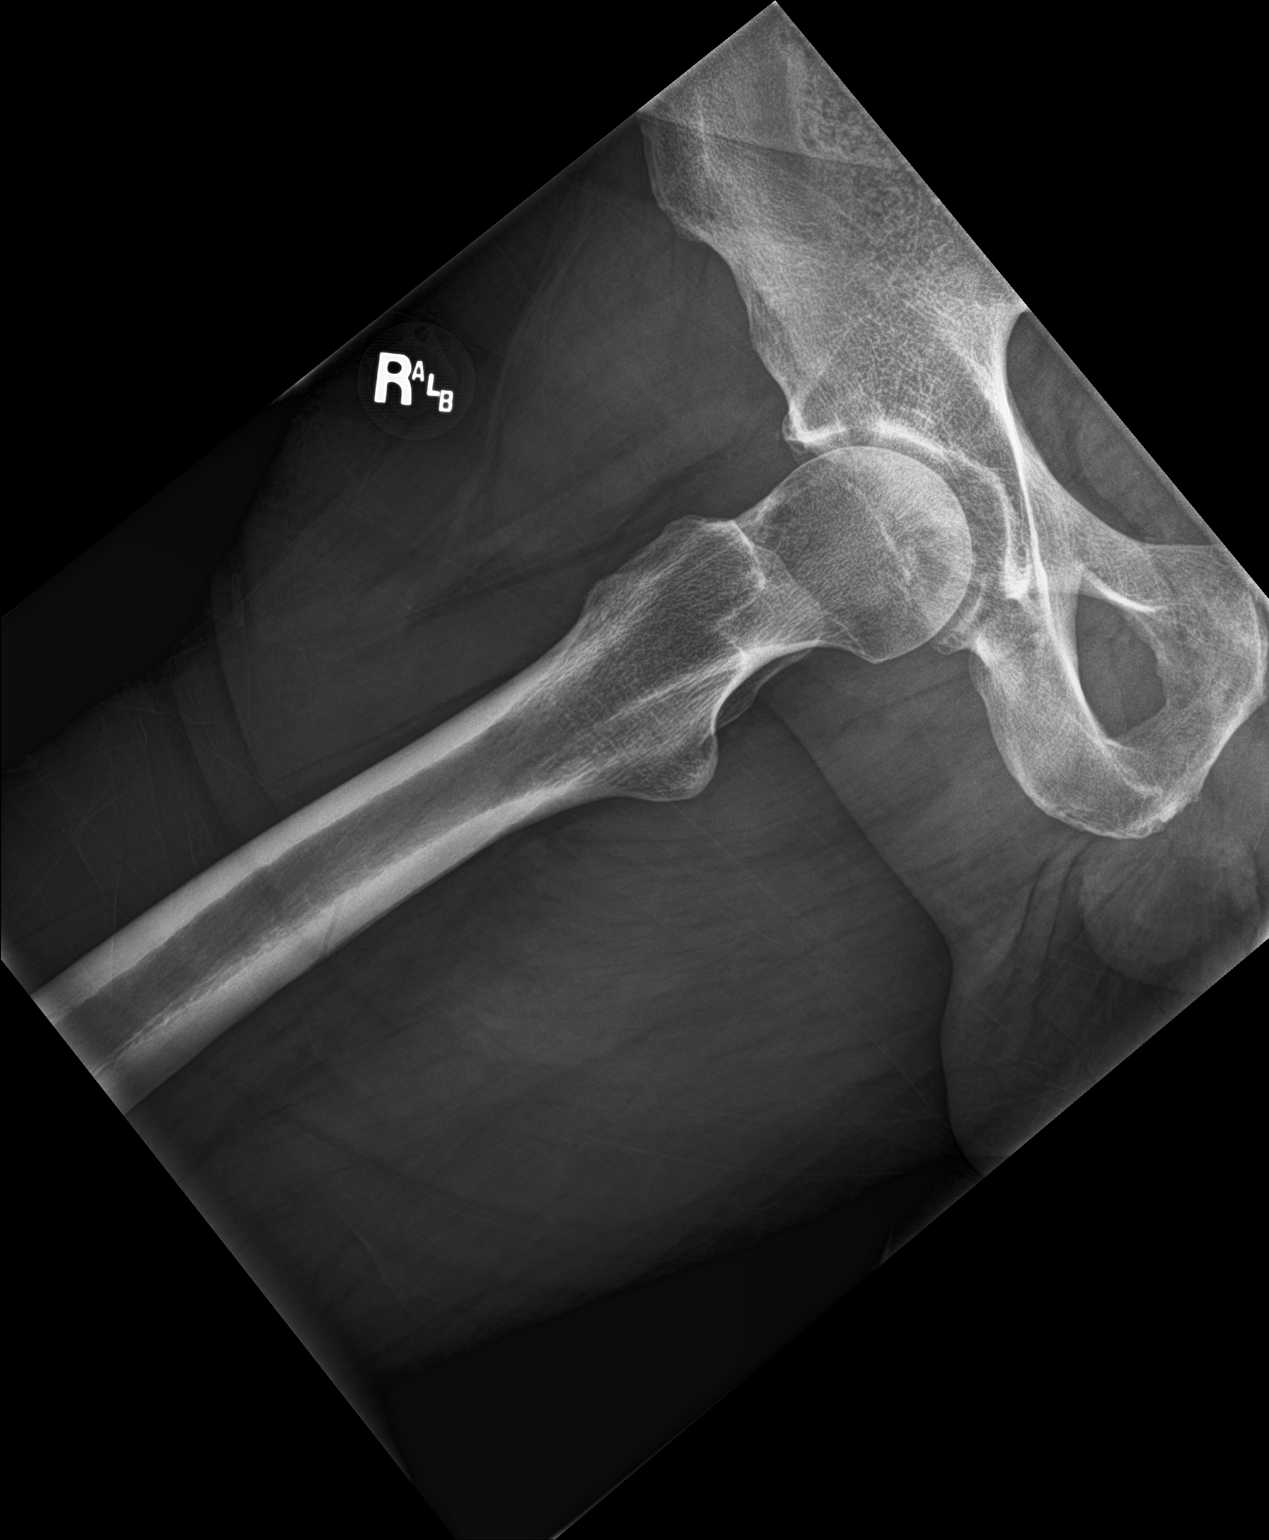

[3 of 3 positions shown; findings below may reference images not displayed]

FINDINGS: There is no evidence of hip fracture or dislocation. Mild joint
space narrowing of the bilateral hips.
IMPRESSION: Mild osteoarthritis of the bilateral hips.

## 2020-07-27 IMAGING — CR DG KNEE COMPLETE 4+V*R*
4 series · 4 of 4 positions shown · non-contrast
Comparison: 03/30/2019

CLINICAL DATA: Right knee pain

EXAM:
RIGHT KNEE - COMPLETE 4+ VIEW

[knee ap]
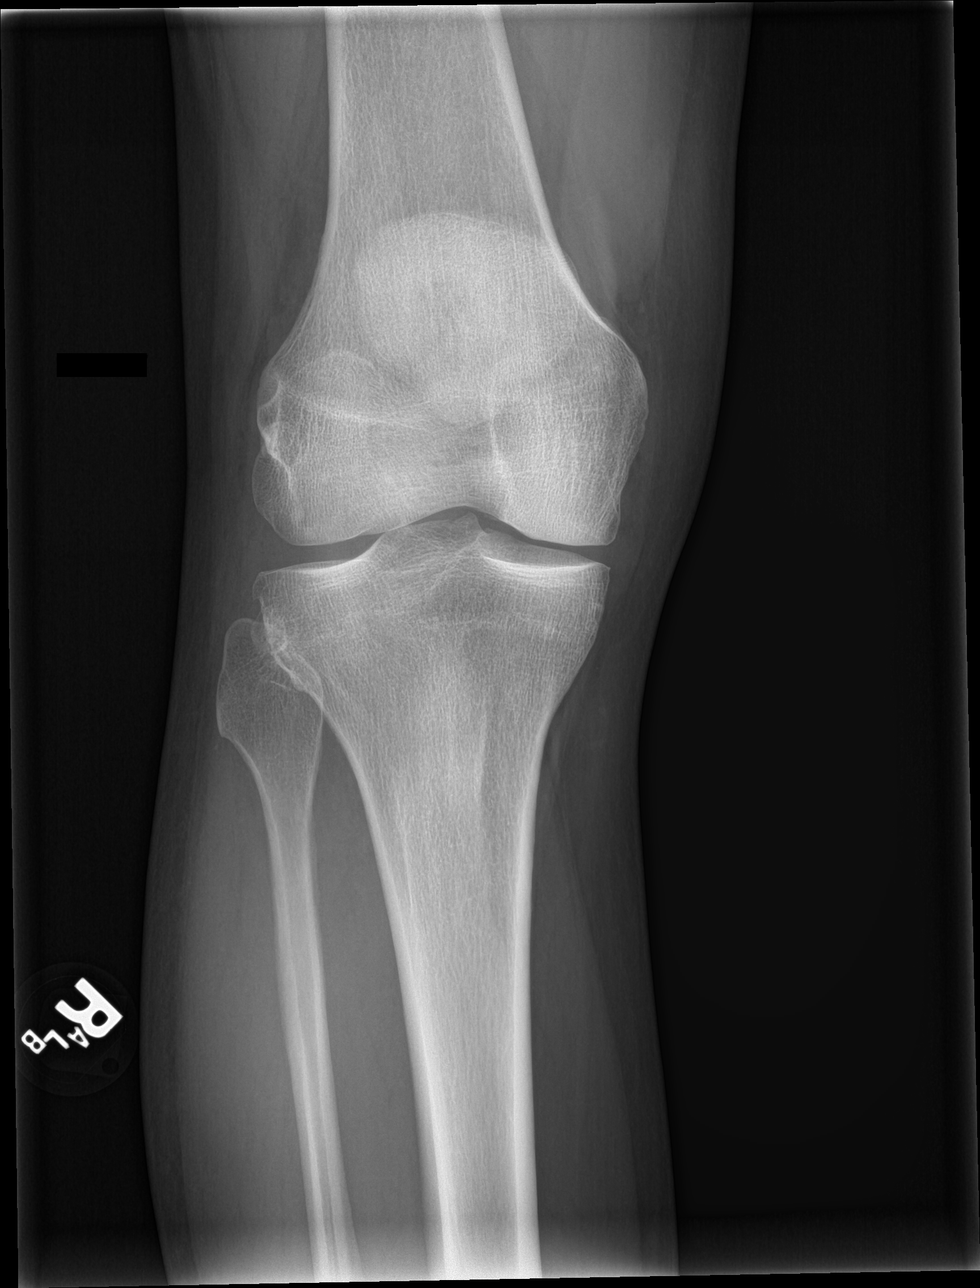

[knee lat]
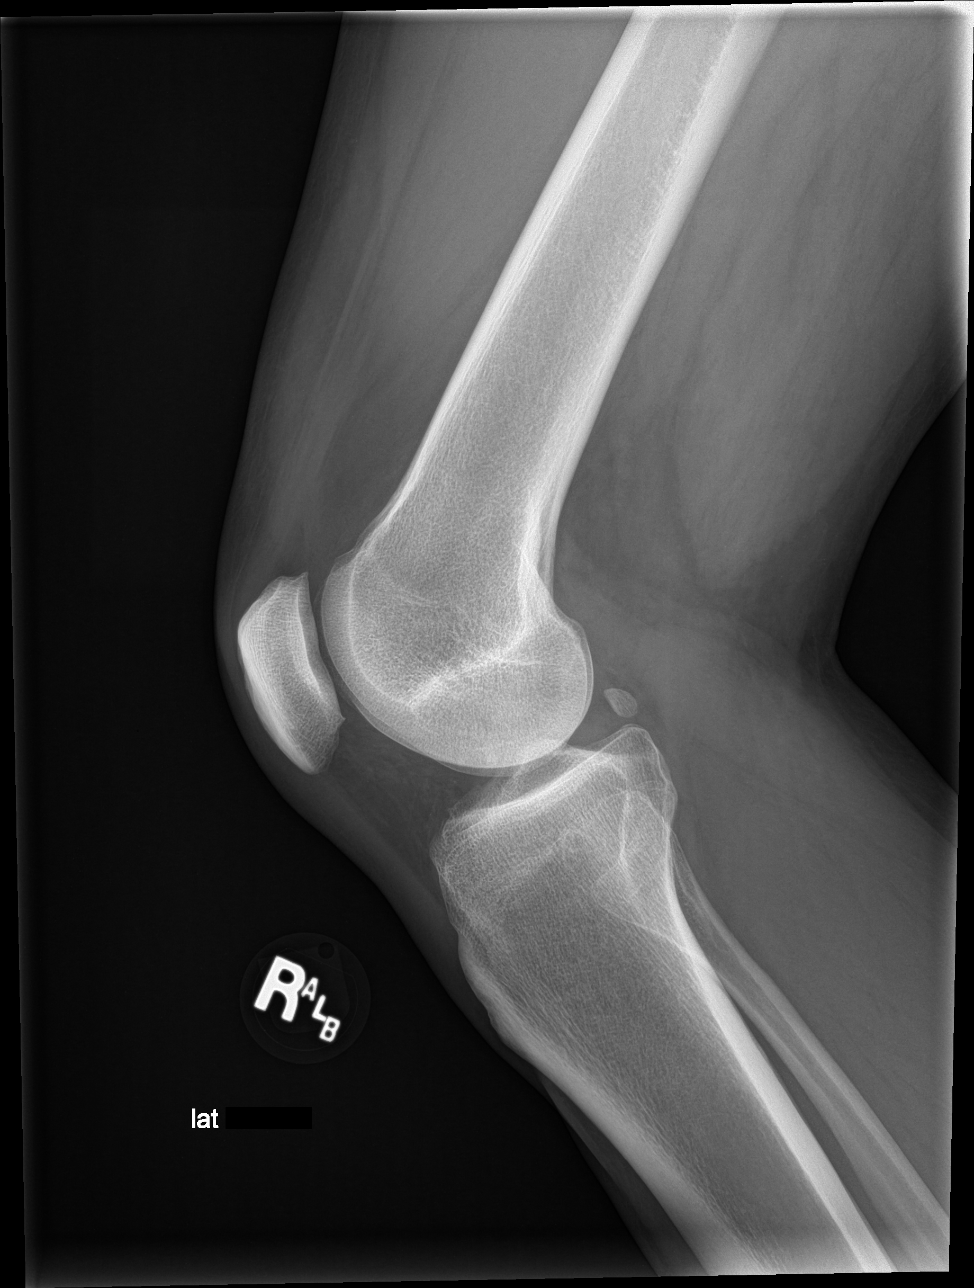

[tunnel]
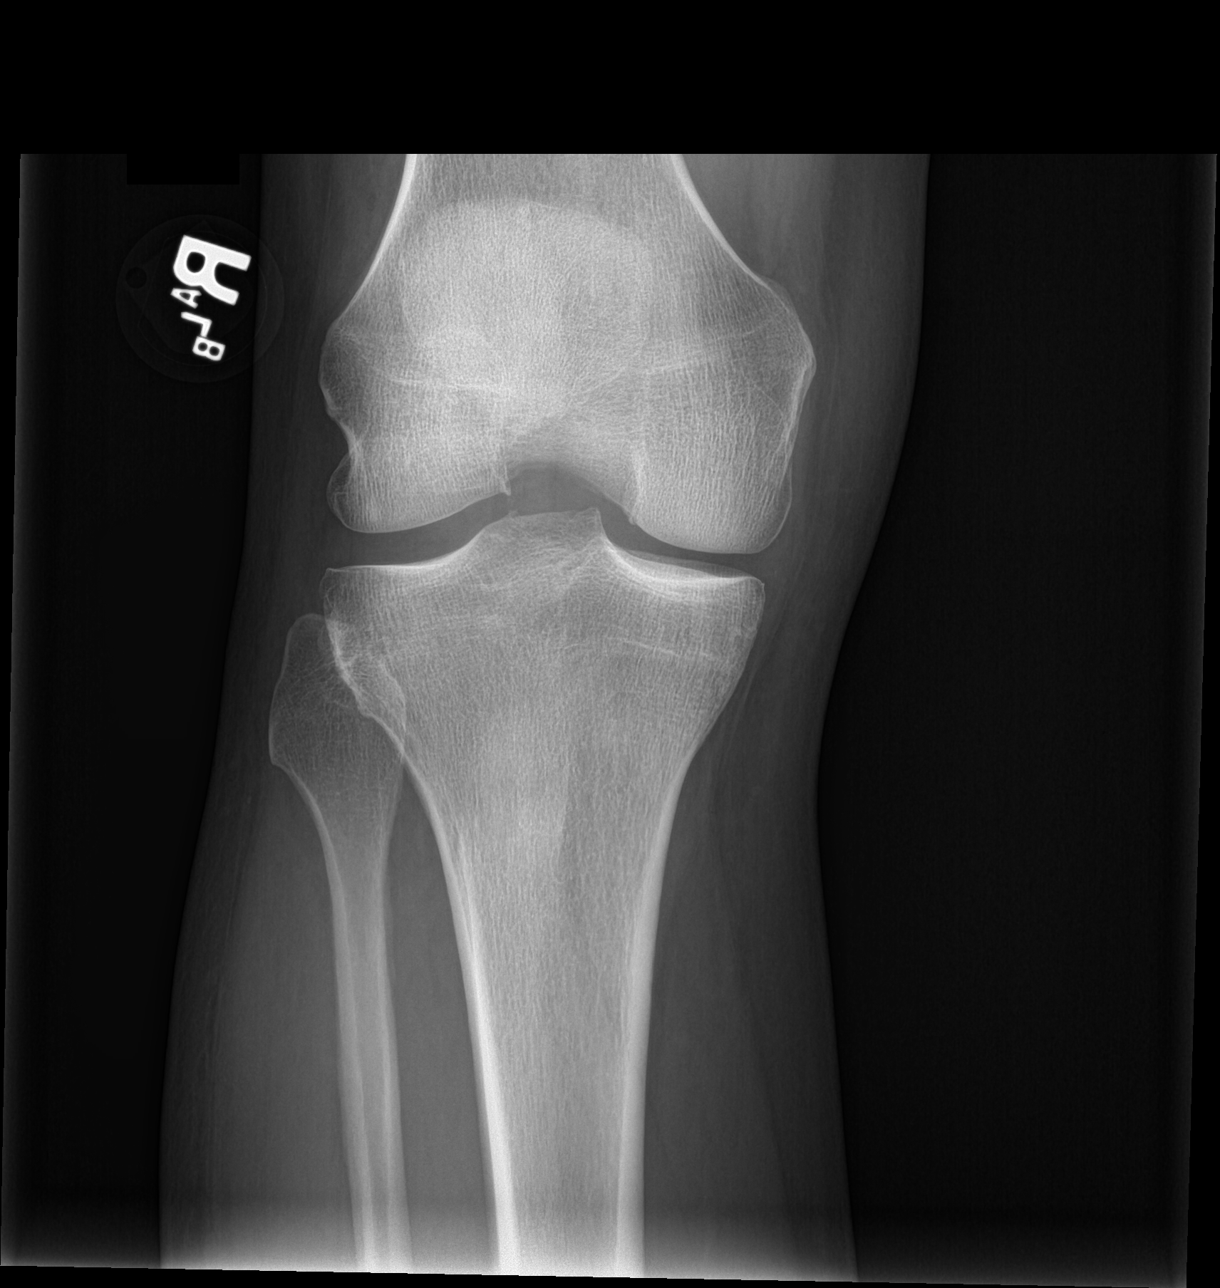

[patella skyline]
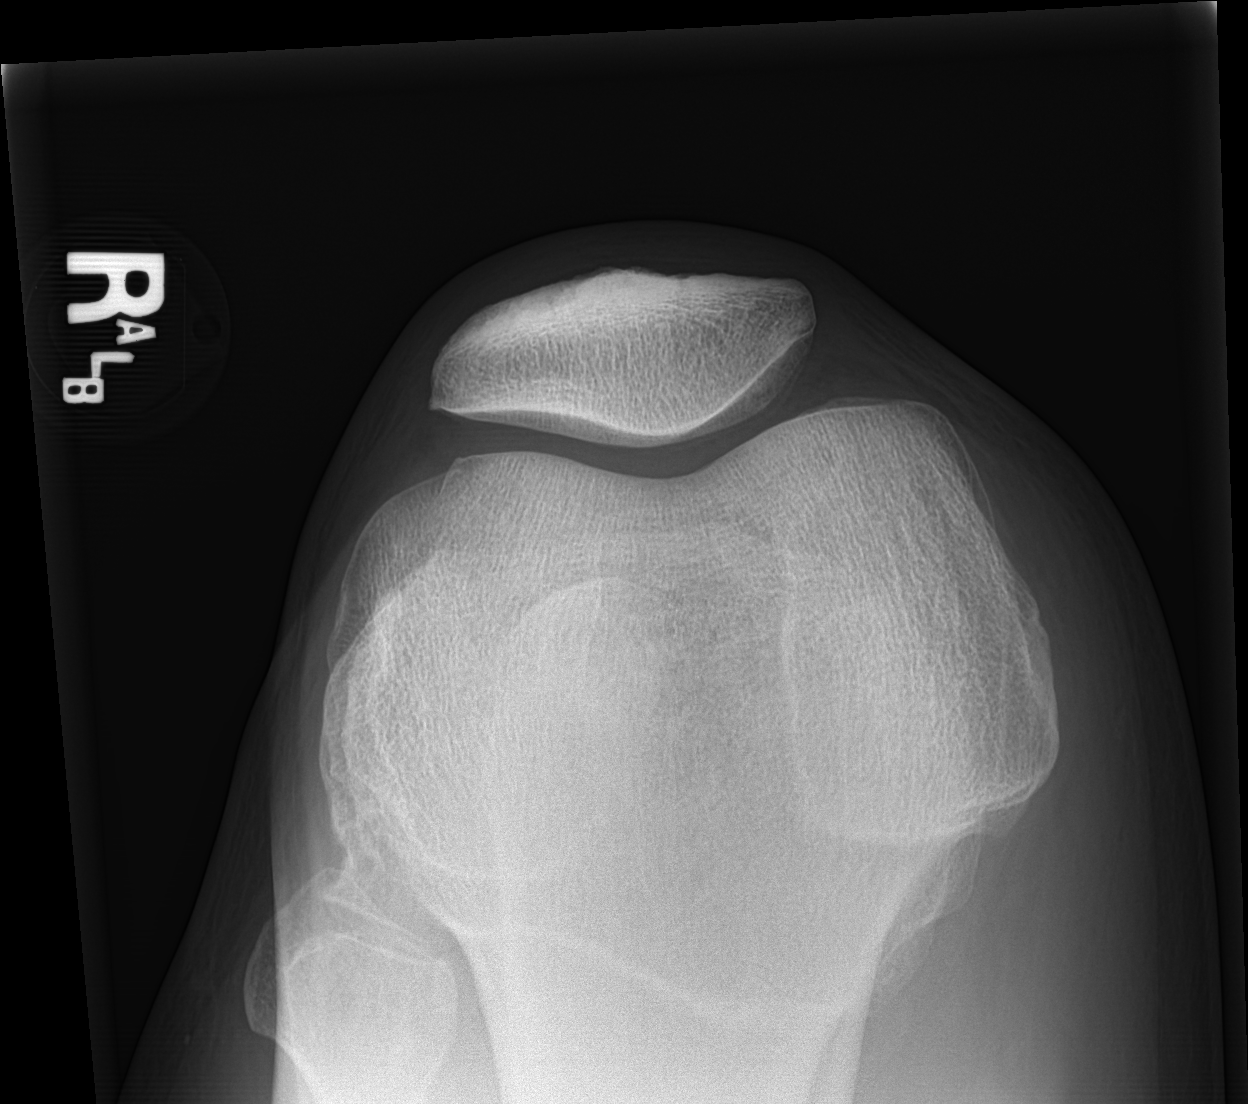

[4 of 4 positions shown; findings below may reference images not displayed]

FINDINGS: No evidence of fracture, dislocation, or joint effusion. No evidence
of arthropathy or other focal bone abnormality. Soft tissues are
unremarkable.
IMPRESSION: Negative.

## 2020-07-30 DIAGNOSIS — M5030 Other cervical disc degeneration, unspecified cervical region: Secondary | ICD-10-CM | POA: Diagnosis not present

## 2020-07-30 DIAGNOSIS — M47812 Spondylosis without myelopathy or radiculopathy, cervical region: Secondary | ICD-10-CM | POA: Diagnosis not present

## 2020-08-08 ENCOUNTER — Other Ambulatory Visit: Payer: Self-pay

## 2020-08-08 ENCOUNTER — Ambulatory Visit
Admission: RE | Admit: 2020-08-08 | Discharge: 2020-08-08 | Disposition: A | Discharge status: Home / Self Care | Payer: Federal, State, Local not specified - PPO | Source: Ambulatory Visit | Attending: Family Medicine | Admitting: Family Medicine

## 2020-08-08 DIAGNOSIS — M542 Cervicalgia: Secondary | ICD-10-CM | POA: Insufficient documentation

## 2020-08-30 ENCOUNTER — Other Ambulatory Visit: Payer: Self-pay | Admitting: Family Medicine

## 2020-09-10 DIAGNOSIS — M47812 Spondylosis without myelopathy or radiculopathy, cervical region: Secondary | ICD-10-CM | POA: Diagnosis not present

## 2020-09-29 ENCOUNTER — Other Ambulatory Visit: Payer: Self-pay | Admitting: Family Medicine

## 2020-11-05 ENCOUNTER — Encounter: Payer: Self-pay | Admitting: Internal Medicine

## 2020-12-11 ENCOUNTER — Ambulatory Visit (AMBULATORY_SURGERY_CENTER): Payer: Self-pay

## 2020-12-11 ENCOUNTER — Other Ambulatory Visit: Payer: Self-pay

## 2020-12-11 VITALS — Ht 73.0 in | Wt 233.0 lb

## 2020-12-11 DIAGNOSIS — Z1211 Encounter for screening for malignant neoplasm of colon: Secondary | ICD-10-CM

## 2020-12-11 MED ORDER — PLENVU 140 G PO SOLR
1.0000 | ORAL | 0 refills | Status: DC
Start: 2020-12-11 — End: 2020-12-23

## 2020-12-11 NOTE — Progress Notes (Signed)
No egg or soy allergy known to patient  No issues with past sedation with any surgeries or procedures Patient denies ever being told they had issues or difficulty with intubation  No FH of Malignant Hyperthermia No diet pills per patient No home 02 use per patient  No blood thinners per patient  Pt denies issues with constipation  No A fib or A flutter  EMMI video via MyChart  COVID 19 guidelines implemented in PV today with Pt and RN  Pt is fully vaccinated for Covid x 2; Coupon given to pt in PV today, Code to Pharmacy and  NO PA's for preps discussed with pt in PV today  Discussed with pt there will be an out-of-pocket cost for prep and that varies from $0 to 70 dollars;  Due to the COVID-19 pandemic we are asking patients to follow certain guidelines.  Pt aware of COVID protocols and [facility] guidelines

## 2020-12-23 ENCOUNTER — Other Ambulatory Visit: Payer: Self-pay

## 2020-12-23 ENCOUNTER — Encounter: Payer: Self-pay | Admitting: Internal Medicine

## 2020-12-23 ENCOUNTER — Ambulatory Visit (AMBULATORY_SURGERY_CENTER): Payer: Federal, State, Local not specified - PPO | Admitting: Internal Medicine

## 2020-12-23 VITALS — BP 121/71 | HR 76 | Temp 96.2°F | Resp 14 | Ht 73.0 in | Wt 233.0 lb

## 2020-12-23 DIAGNOSIS — D124 Benign neoplasm of descending colon: Secondary | ICD-10-CM

## 2020-12-23 DIAGNOSIS — Z1211 Encounter for screening for malignant neoplasm of colon: Secondary | ICD-10-CM | POA: Diagnosis present

## 2020-12-23 MED ORDER — SODIUM CHLORIDE 0.9 % IV SOLN
500.0000 mL | Freq: Once | INTRAVENOUS | Status: DC
Start: 2020-12-23 — End: 2020-12-23

## 2020-12-23 NOTE — Progress Notes (Signed)
Pt's states no medical or surgical changes since previsit or office visit.  CW - vitals 

## 2020-12-23 NOTE — Op Note (Signed)
[facility] Patient Name: Joseph Church Procedure Date: 12/23/2020 1:26 PM MRN: [id number] Endoscopist: Docia Chuck. Joseph Church , MD Age: 60 Referring MD:  Date of Birth: [birth year] Gender: Male Account #: [id number] Procedure:                Colonoscopy with cold snare polypectomy x 1 Indications:              Screening for colorectal malignant neoplasm.                            Reports negative index exam in [facility] 10 years ago Medicines:                Monitored Anesthesia Care Procedure:                Pre-Anesthesia Assessment:                           - Prior to the procedure, a History and Physical                            was performed, and patient medications and                            allergies were reviewed. The patient's tolerance of                            previous anesthesia was also reviewed. The risks                            and benefits of the procedure and the sedation                            options and risks were discussed with the patient.                            All questions were answered, and informed consent                            was obtained. Prior Anticoagulants: The patient has                            taken no previous anticoagulant or antiplatelet                            agents. After reviewing the risks and benefits, the                            patient was deemed in satisfactory condition to                            undergo the procedure.  After obtaining informed consent, the colonoscope                            was passed under direct vision. Throughout the                            procedure, the patient's blood pressure, pulse, and                            oxygen saturations were monitored continuously. The                            Olympus CF-HQ190 [id number]) Colonoscope was                            introduced through the anus and advanced  to the the                            cecum, identified by appendiceal orifice and                            ileocecal valve. The ileocecal valve, appendiceal                            orifice, and rectum were photographed. The quality                            of the bowel preparation was excellent. The                            colonoscopy was performed without difficulty. The                            patient tolerated the procedure well. The bowel                            preparation used was Prepopik via split dose                            instruction. Scope In: 1:38:50 PM Scope Out: 1:53:01 PM Scope Withdrawal Time: 0 hours 12 minutes 2 seconds  Total Procedure Duration: 0 hours 14 minutes 11 seconds  Findings:                 A 3 mm polyp was found in the descending colon. The                            polyp was removed with a cold snare. Resection and                            retrieval were complete.                           Multiple diverticula were found in the left colon  and right colon.                           The exam was otherwise without abnormality on                            direct and retroflexion views. Complications:            No immediate complications. Estimated blood loss:                            None. Estimated Blood Loss:     Estimated blood loss: none. Impression:               - One 3 mm polyp in the descending colon, removed                            with a cold snare. Resected and retrieved.                           - Diverticulosis in the left colon and in the right                            colon.                           - The examination was otherwise normal on direct                            and retroflexion views. Recommendation:           - Repeat colonoscopy in 7 years if polyp                            adenomatous; otherwise 10 years for surveillance.                           - Patient has a  contact number available for                            emergencies. The signs and symptoms of potential                            delayed complications were discussed with the                            patient. Return to normal activities tomorrow.                            Written discharge instructions were provided to the                            patient.                           - Resume previous diet.                           -  Continue present medications.                           - Await pathology results. Docia Chuck. Joseph Pastor, MD 12/23/2020 2:07:43 PM This report has been signed electronically.

## 2020-12-23 NOTE — Progress Notes (Signed)
PT taken to PACU. Monitors in place. VSS. Report given to RN. 

## 2020-12-23 NOTE — Patient Instructions (Signed)
Handout given:  Polyps, Diverticulosis Resume previous diet Continue current medications Await pathology results YOU HAD AN ENDOSCOPIC PROCEDURE TODAY AT [facility]:   Refer to the procedure report that was given to you for any specific questions about what was found during the examination.  If the procedure report does not answer your questions, please call your gastroenterologist to clarify.  If you requested that your care partner not be given the details of your procedure findings, then the procedure report has been included in a sealed envelope for you to review at your convenience later.  YOU SHOULD EXPECT: Some feelings of bloating in the abdomen. Passage of more gas than usual.  Walking can help get rid of the air that was put into your GI tract during the procedure and reduce the bloating. If you had a lower endoscopy (such as a colonoscopy or flexible sigmoidoscopy) you may notice spotting of blood in your stool or on the toilet paper. If you underwent a bowel prep for your procedure, you may not have a normal bowel movement for a few days.  Please Note:  You might notice some irritation and congestion in your nose or some drainage.  This is from the oxygen used during your procedure.  There is no need for concern and it should clear up in a day or so.  SYMPTOMS TO REPORT IMMEDIATELY:   Following lower endoscopy (colonoscopy or flexible sigmoidoscopy):  Excessive amounts of blood in the stool  Significant tenderness or worsening of abdominal pains  Swelling of the abdomen that is new, acute  Fever of 100F or higher   For urgent or emergent issues, a gastroenterologist can be reached at any hour by calling [phone]. Do not use MyChart messaging for urgent concerns.   DIET:  We do recommend a small meal at first, but then you may proceed to your regular diet.  Drink plenty of fluids but you should avoid alcoholic beverages for 24 hours.  ACTIVITY:  You should  plan to take it easy for the rest of today and you should NOT DRIVE or use heavy machinery until tomorrow (because of the sedation medicines used during the test).    FOLLOW UP: Our staff will call the number listed on your records 48-72 hours following your procedure to check on you and address any questions or concerns that you may have regarding the information given to you following your procedure. If we do not reach you, we will leave a message.  We will attempt to reach you two times.  During this call, we will ask if you have developed any symptoms of COVID 19. If you develop any symptoms (ie: fever, flu-like symptoms, shortness of breath, cough etc.) before then, please call [phone].  If you test positive for Covid 19 in the 2 weeks post procedure, please call and report this information to [place].    If any biopsies were taken you will be  SIGNATURES/CONFIDENTIALITY: You and/or your care partner have signed paperwork which will be entered into your electronic medical record.  These signatures attest to the fact that that the information above on your After Visit Summary has been reviewed and is understood.  Full responsibility of the confidentiality of this discharge information lies with you and/or your care-partner.

## 2020-12-23 NOTE — Progress Notes (Signed)
Called to room to assist during endoscopic procedure.  Patient ID and intended procedure confirmed with present staff. Received instructions for my participation in the procedure from the performing physician.  

## 2020-12-25 ENCOUNTER — Telehealth: Payer: Self-pay | Admitting: *Deleted

## 2020-12-25 ENCOUNTER — Telehealth: Payer: Self-pay

## 2020-12-25 NOTE — Telephone Encounter (Signed)
LVM

## 2020-12-25 NOTE — Telephone Encounter (Signed)
  Follow up Call-  Call back number 12/23/2020  Post procedure Call Back phone  # [phone]  Permission to leave phone message Yes  Some recent data might be hidden     First attempt for follow up phone call. No answer at number given.  Left message on voicemail.

## 2020-12-31 ENCOUNTER — Encounter: Payer: Self-pay | Admitting: Internal Medicine

## 2021-09-28 ENCOUNTER — Other Ambulatory Visit: Payer: Self-pay | Admitting: Family Medicine

## 2021-09-29 ENCOUNTER — Institutional Professional Consult (permissible substitution): Payer: Federal, State, Local not specified - PPO | Admitting: Pulmonary Disease

## 2023-01-14 ENCOUNTER — Ambulatory Visit
Admission: RE | Admit: 2023-01-14 | Discharge: 2023-01-14 | Disposition: A | Discharge status: Home / Self Care | Payer: Federal, State, Local not specified - PPO | Source: Ambulatory Visit

## 2023-01-14 VITALS — BP 147/83 | HR 61 | Temp 98.3°F | Resp 14 | Ht 73.0 in | Wt 225.0 lb

## 2023-01-14 DIAGNOSIS — L237 Allergic contact dermatitis due to plants, except food: Secondary | ICD-10-CM

## 2023-01-14 MED ORDER — METHYLPREDNISOLONE ACETATE 80 MG/ML IJ SUSP
80.0000 mg | Freq: Once | INTRAMUSCULAR | Status: AC
  Administered 2023-01-14: 80 mg via INTRAMUSCULAR

## 2023-01-14 MED ORDER — PREDNISONE 10 MG (21) PO TBPK: ORAL_TABLET | Freq: Every day | ORAL | 0 refills | Status: AC

## 2023-01-14 NOTE — ED Provider Notes (Signed)
[facility]    CSN: [id number] Arrival date & time: 01/14/23  1805      History   Chief Complaint Chief Complaint  Patient presents with   Poison Ivy    HPI CHRISTOHPER Church is a 62 y.o. male.   HPI Pleasant 62 year old male presents with rash and exposure to poison ivy in backyard 5 days ago, while attempting to clear property.  Patient requests steroid shot in clinic this evening and 10 days worth of steroid taper.  PMH significant for obesity, HTN, and HLD.  Past Medical History:  Diagnosis Date   Allergy    seasonal allergies   Anxiety    situational anxiety   Depression    hx of   GERD (gastroesophageal reflux disease)    on meds   Hyperlipidemia    on meds   Hypertension    on meds   Sleep apnea    uses CPAP    Patient Active Problem List   Diagnosis Date Noted   Right hip pain 01/24/2020   Trigger thumb of left hand 03/30/2019   Traumatic rupture of right UCL, subsequent encounter 03/30/2019   Chronic heel pain, right 03/30/2019   Right knee pain 03/30/2019   Meralgia paresthetica of both lower extremities 12/01/2017   Insomnia 08/12/2016   Erectile dysfunction 07/07/2013   GERD (gastroesophageal reflux disease)    Hyperlipidemia    Hypertension     Past Surgical History:  Procedure Laterality Date   CARDIAC CATHETERIZATION     [facility];[place]   CHOLECYSTECTOMY  2000   COLONOSCOPY     at age 37-[facility]   keratotomy  80   TONSILLECTOMY AND ADENOIDECTOMY  1960's   VASECTOMY  1987   VASECTOMY REVERSAL  1994       Home Medications    Prior to Admission medications   Medication Sig Start Date End Date Taking? Authorizing Provider  ondansetron (ZOFRAN-ODT) 4 MG disintegrating tablet Take 4 mg by mouth every 8 (eight) hours as needed. 12/10/22  Yes [provider]  predniSONE (STERAPRED UNI-PAK 21 TAB) 10 MG (21) TBPK tablet Take by mouth daily. Take 6 tabs by mouth daily  for 2  days, then 5 tabs for 2 days, then 4 tabs for 2 days, then 3 tabs for 2 days, 2 tabs for 2 days, then 1 tab by mouth daily for 2 days 01/14/23  Yes Trevor Iha, FNP  scopolamine (TRANSDERM-SCOP) 1 MG/3DAYS SMARTSIG:Patch(s) Topical 12/10/22  Yes [provider]  tadalafil (CIALIS) 5 MG tablet Take by mouth. 07/13/22 07/13/23 Yes [provider]  acetaminophen (TYLENOL) 500 MG tablet Take by mouth daily as needed.    [provider]  baclofen (LIORESAL) 10 MG tablet Take 1 tablet (10 mg total) by mouth 3 (three) times daily. Patient taking differently: Take 10 mg by mouth 3 (three) times daily as needed. 09/06/19   Dianne Dun, MD  ibuprofen (ADVIL) 800 MG tablet Take 1 tablet (800 mg total) by mouth every 8 (eight) hours as needed. 09/06/19   Dianne Dun, MD  Melatonin 5 MG CHEW Chew by mouth.    [provider]  olmesartan (BENICAR) 20 MG tablet TAKE 1 TABLET(20 MG) BY MOUTH DAILY 08/30/20   Gweneth Dimitri, MD  omeprazole (PRILOSEC) 20 MG capsule TAKE 1 CAPSULE (20 MG TOTAL) BY MOUTH DAILY AS NEEDED. Patient taking differently: Take 20 mg by mouth daily. 07/10/19   Dianne Dun, MD  Omeprazole  Magnesium (PRILOSEC PO)     [provider]  rosuvastatin (CRESTOR) 40 MG tablet TAKE 1 TABLET BY MOUTH EVERY DAY AT BEDTIME 09/30/20   Gweneth Dimitri, MD  SAXENDA 18 MG/3ML SOPN Inject into the skin.    [provider]  zolpidem (AMBIEN) 10 MG tablet Take 10 mg by mouth at bedtime as needed. 11/20/20   [provider]    Family History Family History  Problem Relation Age of Onset   Breast cancer Mother    Hypertension Father    Hyperlipidemia Father    Heart disease Father 25       CABG x 4   Lymphoma Father        lymphoma   Lung cancer Maternal Grandfather    Colon cancer Neg Hx    Esophageal cancer Neg Hx    Stomach cancer Neg Hx    Rectal cancer Neg Hx     Social History Social History   Tobacco Use   Smoking status:  Never   Smokeless tobacco: Never  Vaping Use   Vaping Use: Never used  Substance Use Topics   Alcohol use: Yes    Comment: 4 [place] a week   Drug use: No     Allergies   Lisinopril   Review of Systems Review of Systems  Skin:  Positive for rash.  All other systems reviewed and are negative.    Physical Exam Triage Vital Signs ED Triage Vitals  Enc Vitals Group     BP      Pulse      Resp      Temp      Temp src      SpO2      Weight      Height      Head Circumference      Peak Flow      Pain Score      Pain Loc      Pain Edu?      Excl. in GC?    No data found.  Updated Vital Signs BP (!) 147/83 (BP Location: Left Arm)   Pulse 61   Temp 98.3 F (36.8 C) (Oral)   Resp 14   Ht 6\' 1"  (1.854 m)   Wt 225 lb (102.1 kg)   SpO2 98%   BMI 29.69 kg/m       Physical Exam Vitals and nursing note reviewed.  Constitutional:      Appearance: Normal appearance. He is obese.  HENT:     Head: Normocephalic and atraumatic.     Mouth/Throat:     Mouth: Mucous membranes are moist.     Pharynx: Oropharynx is clear.  Eyes:     Extraocular Movements: Extraocular movements intact.     Conjunctiva/sclera: Conjunctivae normal.     Pupils: Pupils are equal, round, and reactive to light.  Cardiovascular:     Rate and Rhythm: Normal rate and regular rhythm.     Pulses: Normal pulses.     Heart sounds: Normal heart sounds.  Pulmonary:     Effort: Pulmonary effort is normal.     Breath sounds: Normal breath sounds. No wheezing, rhonchi or rales.  Musculoskeletal:        General: Normal range of motion.     Cervical back: Normal range of motion and neck supple.  Skin:    General: Skin is warm and dry.     Comments: Face/neck/upper/lower arms/abdomen/upper/lower legs: Pruritic erythematous macular papular eruption with grouped  crusted linear vesicular lesions noted  Neurological:     General: No focal deficit present.     Mental Status: He is alert and oriented to  person, place, and time. Mental status is at baseline.      UC Treatments / Results  Labs (all labs ordered are listed, but only abnormal results are displayed) Labs Reviewed - No data to display  EKG   Radiology No results found.  Procedures Procedures (including critical care time)  Medications Ordered in UC Medications  methylPREDNISolone acetate (DEPO-MEDROL) injection 80 mg (80 mg Intramuscular Given 01/14/23 1834)    Initial Impression / Assessment and Plan / UC Course  I have reviewed the triage vital signs and the nursing notes.  Pertinent labs & imaging results that were available during my care of the patient were reviewed by me and considered in my medical decision making (see chart for details).     DM: 1.  Poison ivy dermatitis-IM Depo-Medrol 80 mg given once in clinic prior to discharge, Rx'd Sterapred Unipak (tapering from 60 mg to 10 mg over 10 days). Instructed patient to take medication as directed with food to completion.  Encouraged increase daily water intake to 64 ounces per day while taking this medication.  Advised if symptoms worsen and/or unresolved please follow-up with PCP or here for further evaluation.  Discharged home, hemodynamically stable. Final Clinical Impressions(s) / UC Diagnoses   Final diagnoses:  Poison ivy dermatitis     Discharge Instructions      Instructed patient to take medication as directed with food to completion.  Encouraged increase daily water intake to 64 ounces per day while taking this medication.  Advised if symptoms worsen and/or unresolved please follow-up with PCP or here for further evaluation.     ED Prescriptions     Medication Sig Dispense Auth. Provider   predniSONE (STERAPRED UNI-PAK 21 TAB) 10 MG (21) TBPK tablet Take by mouth daily. Take 6 tabs by mouth daily  for 2 days, then 5 tabs for 2 days, then 4 tabs for 2 days, then 3 tabs for 2 days, 2 tabs for 2 days, then 1 tab by mouth daily for 2 days 42  tablet Trevor Iha, FNP      PDMP not reviewed this encounter.   Trevor Iha, FNP 01/14/23 [phone]

## 2023-01-14 NOTE — Discharge Instructions (Addendum)
Instructed patient to take medication as directed with food to completion.  Encouraged increase daily water intake to 64 ounces per day while taking this medication.  Advised if symptoms worsen and/or unresolved please follow-up with PCP or here for further evaluation.

## 2023-01-14 NOTE — ED Triage Notes (Signed)
Exposure to poison ivy in back yard on Saturday  Rash to all extremities, behind left ear Pt would like  a 10 day course of steroids  & a shot here

## 2023-01-22 ENCOUNTER — Telehealth: Payer: Self-pay | Admitting: Family Medicine

## 2023-01-22 MED ORDER — PREDNISONE 10 MG (21) PO TBPK: ORAL_TABLET | Freq: Every day | ORAL | 0 refills | Status: AC

## 2023-01-22 NOTE — Telephone Encounter (Signed)
Patient request refill of Sterapred Unipak-given worsening poison ivy of upper and lower extremity.  Medication sent to patient's pharmacy per request.
